# Patient Record
Sex: Female | Born: 1975 | Race: White | Hispanic: No | Marital: Single | Smoking: Former smoker
Health system: Southern US, Community
[De-identification: ages and names within clinical notes are randomized; demographics above are authoritative.]

## PROBLEM LIST (undated history)

## (undated) DIAGNOSIS — N12 Tubulo-interstitial nephritis, not specified as acute or chronic: Secondary | ICD-10-CM

## (undated) DIAGNOSIS — F329 Major depressive disorder, single episode, unspecified: Secondary | ICD-10-CM

## (undated) DIAGNOSIS — N289 Disorder of kidney and ureter, unspecified: Secondary | ICD-10-CM

## (undated) DIAGNOSIS — K859 Acute pancreatitis without necrosis or infection, unspecified: Secondary | ICD-10-CM

## (undated) HISTORY — DX: Major depressive disorder, single episode, unspecified: F32.9

## (undated) HISTORY — DX: Tubulo-interstitial nephritis, not specified as acute or chronic: N12

## (undated) HISTORY — PX: APPENDECTOMY: SHX54

## (undated) HISTORY — DX: Acute pancreatitis without necrosis or infection, unspecified: K85.90

---

## 2008-01-07 DIAGNOSIS — N12 Tubulo-interstitial nephritis, not specified as acute or chronic: Secondary | ICD-10-CM

## 2008-01-07 HISTORY — DX: Tubulo-interstitial nephritis, not specified as acute or chronic: N12

## 2008-03-17 ENCOUNTER — Encounter: Payer: Self-pay | Admitting: Emergency Medicine

## 2008-03-17 ENCOUNTER — Inpatient Hospital Stay (HOSPITAL_COMMUNITY): Admission: EM | Admit: 2008-03-17 | Discharge: 2008-03-21 | Payer: Self-pay | Admitting: Internal Medicine

## 2008-03-17 ENCOUNTER — Ambulatory Visit: Payer: Self-pay | Admitting: Diagnostic Radiology

## 2008-03-20 ENCOUNTER — Encounter: Payer: Self-pay | Admitting: Internal Medicine

## 2008-07-01 ENCOUNTER — Emergency Department (HOSPITAL_BASED_OUTPATIENT_CLINIC_OR_DEPARTMENT_OTHER): Admission: EM | Admit: 2008-07-01 | Discharge: 2008-07-01 | Payer: Self-pay | Admitting: Emergency Medicine

## 2008-07-20 ENCOUNTER — Ambulatory Visit: Payer: Self-pay | Admitting: Diagnostic Radiology

## 2008-07-20 ENCOUNTER — Emergency Department (HOSPITAL_BASED_OUTPATIENT_CLINIC_OR_DEPARTMENT_OTHER): Admission: EM | Admit: 2008-07-20 | Discharge: 2008-07-20 | Payer: Self-pay | Admitting: Emergency Medicine

## 2008-11-06 DIAGNOSIS — K859 Acute pancreatitis without necrosis or infection, unspecified: Secondary | ICD-10-CM

## 2008-11-06 HISTORY — DX: Acute pancreatitis without necrosis or infection, unspecified: K85.90

## 2008-11-09 ENCOUNTER — Inpatient Hospital Stay (HOSPITAL_COMMUNITY): Admission: EM | Admit: 2008-11-09 | Discharge: 2008-11-18 | Payer: Self-pay | Admitting: Internal Medicine

## 2008-11-09 ENCOUNTER — Other Ambulatory Visit: Payer: Self-pay | Admitting: Emergency Medicine

## 2009-01-04 ENCOUNTER — Emergency Department (HOSPITAL_COMMUNITY): Admission: EM | Admit: 2009-01-04 | Discharge: 2009-01-05 | Payer: Self-pay | Admitting: Emergency Medicine

## 2009-01-10 ENCOUNTER — Ambulatory Visit (HOSPITAL_COMMUNITY): Admission: RE | Admit: 2009-01-10 | Discharge: 2009-01-10 | Payer: Self-pay | Admitting: Gastroenterology

## 2009-01-10 HISTORY — PX: OTHER SURGICAL HISTORY: SHX169

## 2009-01-26 ENCOUNTER — Emergency Department (HOSPITAL_COMMUNITY): Admission: EM | Admit: 2009-01-26 | Discharge: 2009-01-26 | Payer: Self-pay | Admitting: Emergency Medicine

## 2009-02-17 ENCOUNTER — Emergency Department (HOSPITAL_COMMUNITY): Admission: EM | Admit: 2009-02-17 | Discharge: 2009-02-17 | Payer: Self-pay | Admitting: Emergency Medicine

## 2009-03-03 ENCOUNTER — Emergency Department (HOSPITAL_BASED_OUTPATIENT_CLINIC_OR_DEPARTMENT_OTHER): Admission: EM | Admit: 2009-03-03 | Discharge: 2009-03-04 | Payer: Self-pay | Admitting: Emergency Medicine

## 2009-03-09 ENCOUNTER — Emergency Department (HOSPITAL_BASED_OUTPATIENT_CLINIC_OR_DEPARTMENT_OTHER): Admission: EM | Admit: 2009-03-09 | Discharge: 2009-03-09 | Payer: Self-pay | Admitting: Emergency Medicine

## 2009-04-27 ENCOUNTER — Emergency Department (HOSPITAL_BASED_OUTPATIENT_CLINIC_OR_DEPARTMENT_OTHER): Admission: EM | Admit: 2009-04-27 | Discharge: 2009-04-27 | Payer: Self-pay | Admitting: Emergency Medicine

## 2009-04-27 ENCOUNTER — Ambulatory Visit: Payer: Self-pay | Admitting: Diagnostic Radiology

## 2009-05-11 ENCOUNTER — Emergency Department (HOSPITAL_COMMUNITY): Admission: EM | Admit: 2009-05-11 | Discharge: 2009-05-11 | Payer: Self-pay | Admitting: Emergency Medicine

## 2009-06-01 ENCOUNTER — Emergency Department (HOSPITAL_COMMUNITY): Admission: EM | Admit: 2009-06-01 | Discharge: 2009-06-02 | Payer: Self-pay | Admitting: Emergency Medicine

## 2009-08-27 ENCOUNTER — Emergency Department (HOSPITAL_BASED_OUTPATIENT_CLINIC_OR_DEPARTMENT_OTHER): Admission: EM | Admit: 2009-08-27 | Discharge: 2009-08-27 | Payer: Self-pay | Admitting: Emergency Medicine

## 2009-10-08 ENCOUNTER — Emergency Department (HOSPITAL_COMMUNITY): Admission: EM | Admit: 2009-10-08 | Discharge: 2009-10-08 | Payer: Self-pay | Admitting: Emergency Medicine

## 2009-10-10 ENCOUNTER — Ambulatory Visit (HOSPITAL_COMMUNITY): Admission: RE | Admit: 2009-10-10 | Discharge: 2009-10-10 | Payer: Self-pay | Admitting: Gastroenterology

## 2009-10-26 ENCOUNTER — Encounter: Admission: RE | Admit: 2009-10-26 | Discharge: 2009-10-26 | Payer: Self-pay | Admitting: Internal Medicine

## 2009-11-06 HISTORY — PX: LAPAROSCOPIC CHOLECYSTECTOMY: SUR755

## 2009-11-15 ENCOUNTER — Ambulatory Visit (HOSPITAL_COMMUNITY): Admission: RE | Admit: 2009-11-15 | Discharge: 2009-11-15 | Payer: Self-pay | Admitting: General Surgery

## 2010-01-26 ENCOUNTER — Encounter: Payer: Self-pay | Admitting: Internal Medicine

## 2010-01-27 ENCOUNTER — Encounter: Payer: Self-pay | Admitting: Gastroenterology

## 2010-01-29 ENCOUNTER — Emergency Department (HOSPITAL_BASED_OUTPATIENT_CLINIC_OR_DEPARTMENT_OTHER)
Admission: EM | Admit: 2010-01-29 | Discharge: 2010-01-29 | Payer: Self-pay | Source: Home / Self Care | Admitting: Emergency Medicine

## 2010-01-30 LAB — URINALYSIS, ROUTINE W REFLEX MICROSCOPIC
Ketones, ur: NEGATIVE mg/dL
Urine Glucose, Fasting: NEGATIVE mg/dL
pH: 6.5 (ref 5.0–8.0)

## 2010-01-30 LAB — CBC
Hemoglobin: 13.5 g/dL (ref 12.0–15.0)
MCH: 32.1 pg (ref 26.0–34.0)
RBC: 4.2 MIL/uL (ref 3.87–5.11)
WBC: 10.4 10*3/uL (ref 4.0–10.5)

## 2010-01-30 LAB — COMPREHENSIVE METABOLIC PANEL
ALT: 20 U/L (ref 0–35)
AST: 18 U/L (ref 0–37)
Alkaline Phosphatase: 92 U/L (ref 39–117)
CO2: 19 mEq/L (ref 19–32)
Chloride: 114 mEq/L — ABNORMAL HIGH (ref 96–112)
GFR calc Af Amer: >60 mL/min (ref >60)
GFR calc non Af Amer: >60 mL/min (ref >60)
Potassium: 4.2 mEq/L (ref 3.5–5.1)
Sodium: 146 mEq/L — ABNORMAL HIGH (ref 135–145)
Total Bilirubin: 0.6 mg/dL (ref 0.3–1.2)

## 2010-01-30 LAB — URINE MICROSCOPIC-ADD ON

## 2010-01-30 LAB — DIFFERENTIAL
Basophils Absolute: 0 10*3/uL (ref 0.0–0.1)
Eosinophils Absolute: 0.1 10*3/uL (ref 0.0–0.7)
Eosinophils Relative: 1 % (ref 0–5)
Lymphocytes Relative: 14 % (ref 12–46)

## 2010-02-01 LAB — URINE CULTURE

## 2010-02-12 ENCOUNTER — Encounter: Payer: Self-pay | Admitting: Gastroenterology

## 2010-02-12 ENCOUNTER — Ambulatory Visit (INDEPENDENT_AMBULATORY_CARE_PROVIDER_SITE_OTHER): Payer: Self-pay | Admitting: Gastroenterology

## 2010-02-12 ENCOUNTER — Encounter: Payer: Self-pay | Admitting: Internal Medicine

## 2010-02-12 DIAGNOSIS — K921 Melena: Secondary | ICD-10-CM

## 2010-02-12 DIAGNOSIS — R109 Unspecified abdominal pain: Secondary | ICD-10-CM

## 2010-02-14 ENCOUNTER — Ambulatory Visit (HOSPITAL_COMMUNITY)
Admission: RE | Admit: 2010-02-14 | Discharge: 2010-02-14 | Disposition: A | Discharge status: Home / Self Care | Payer: Self-pay | Source: Ambulatory Visit | Attending: Internal Medicine | Admitting: Internal Medicine

## 2010-02-14 ENCOUNTER — Encounter: Payer: Self-pay | Admitting: Internal Medicine

## 2010-02-14 DIAGNOSIS — R109 Unspecified abdominal pain: Secondary | ICD-10-CM

## 2010-02-14 DIAGNOSIS — K921 Melena: Secondary | ICD-10-CM | POA: Insufficient documentation

## 2010-02-14 HISTORY — PX: OTHER SURGICAL HISTORY: SHX169

## 2010-02-19 ENCOUNTER — Telehealth (INDEPENDENT_AMBULATORY_CARE_PROVIDER_SITE_OTHER): Payer: Self-pay

## 2010-02-21 NOTE — Letter (Signed)
Summary: TCS ORDER  TCS ORDER   Imported By: Ave Filter 02/12/2010 16:50:23  _____________________________________________________________________  External Attachment:    Type:   Image     Comment:   External Document

## 2010-02-21 NOTE — Assessment & Plan Note (Signed)
Summary: SEVERE ABD PAIN/GALLBLADDER REMOVED IN NOV/LAW PT IS INDIGENT...   Vital Signs:  Patient profile:   35 year old female Height:      67 inches Weight:      143.5 pounds BMI:     22.56 Temp:     99.1 degrees F oral Pulse rate:   80 / minute BP sitting:   128 / 70  (left arm) Cuff size:   regular  Vitals Entered By: Hendricks Limes LPN (February 12, 2010 11:06 AM)  Visit Type:  Initial Consult Referring Provider:  self-referral Primary Care Provider:  None  CC:  abd pain.  History of Present Illness: Valerie Mitchell is a 35 year old Caucasian female who presents today as a self-referral secondary to chronic abdominal pain. She is tearful and seems anxious. She has a hx significant for pancreatitis in 11/2008, thought to be ETOH related. She subsequently had an EUS by Dr. Dulce Sellar in Jan 2011 showing no findings suggestive of chronic pancreatitis, normal pancreas. Continued to have problems, was found to have essentially 0% EF on HIDA. Underwent lap chole 11/11. States pain has continued and not been relieved. No colonoscopy in past. Was being followed at [facility], yet states they won't see her anymore. I do not have these records available currently.  Pt reports today constant lower abdominal pain, described as "gnawing" pain, but worsened after eating, "stabbing" for a few months. Started 2 weeks prior to cholecystectomy, which was in Nov 2011. No dysphagia or odynophagia. No reflux. Reports postprandial loose stools with each meal, X 1 year. +constant nausea. +blood in stool, "clots" since before cholecystectomy. +wt loss, 20 lbs since cholecystectomy. +lack of appetite. Currently non-smoker, no ETOH use, stopped after pancreatitis. No NSAIDs, takes tylenol. states has been running a low-grade fever of 99-100 over past few weeks.   01/2010 labs from ED: nl lipase, LFTs normal, no anemia. See in [place].     Current Medications (verified): 1)  None  Allergies  (verified): No Known Drug Allergies  Past History:  Past Medical History: Depression: secondary to chronic issues r/t abdominal pain Pancreatitis 11/2008, likely r/t ETOH  EUS 01/10/09: normal pancreas  Past Surgical History: Lap cholecystectomy  Family History: Mother:living, HTN, hx CVA Father:living, unknown +FH of colon ca, maternal grandfather   Social History: Has a girlfriend Unemployed currently secondary to chronic pain, since Dec 2011, used to work for [employer] Patient is a former smoker. quit 2010 Alcohol Use - no, +use in past Smoking Status:  quit  Review of Systems General:  Denies fever, chills, and anorexia. Eyes:  Denies blurring, irritation, and discharge. ENT:  Denies sore throat, hoarseness, and difficulty swallowing. CV:  Denies chest pains and syncope. Resp:  Denies dyspnea at rest and wheezing. GI:  Complains of nausea, abdominal pain, diarrhea, and bloody BM's; denies difficulty swallowing and pain on swallowing. GU:  Denies urinary burning and urinary frequency. MS:  Denies joint pain / LOM, joint swelling, and joint stiffness. Derm:  Denies rash, itching, and dry skin. Neuro:  Denies weakness and syncope. Psych:  Complains of depression; denies anxiety and suicidal ideation. Endo:  Denies cold intolerance and heat intolerance.  Physical Exam  General:  Well developed, well nourished, no acute distress. Head:  Normocephalic and atraumatic. Eyes:  sclera without icterus Lungs:  Clear throughout to auscultation. Heart:  Regular rate and rhythm; no murmurs, rubs,  or bruits. Abdomen:  +BS, soft, tender to palpation bilaterally lower abdomen, no rebound or guarding, no  HSM. No masses noted.  Msk:  Symmetrical with no gross deformities. Normal posture. Pulses:  Normal pulses noted. Extremities:  No clubbing, cyanosis, edema or deformities noted. Neurologic:  Alert and  oriented x4;  grossly normal neurologically. Skin:  Intact without  significant lesions or rashes. Psych:  Alert and cooperative. tearful.    Impression & Recommendations:  Problem # 1:  ABDOMINAL PAIN, LOWER (ICD-1.54)  35 year old Caucasian female, quite concerned and tearful today, self-referred secondary to chronic pain. Was being followed at [facility]; reports they won't see her anymore. hx significant for pancreatitis, ETOH related in 2010, f/u EUS in Jan 2011 without findings of chronic pancreatitis. Lap chole Nov 2011. Continued issues with chronic abdominal pain, now reported as lower abdomen, gnawing and stabbing, worsened 30 min after eating. +postprandial loose stools with brbpr, clots. No prior colonoscopy. May be component of untreated IBS, can't exclude IBD. As of note, some concerns mentioned in records concerning drug-seeking.    Obtain records from [facility] in very near future with Dr. Jena Gauss: the R/B/A have been discussed in detail. Pt states understanding.  Further rec's at colonoscopy  Orders: Consultation Level III (04540)  Problem # 2:  HEMATOCHEZIA (ICD-578.1)  See  #1  Orders: Consultation Level III (98119)   Orders Added: 1)  Consultation Level III [14782]

## 2010-02-22 ENCOUNTER — Encounter: Payer: Self-pay | Admitting: Internal Medicine
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
## 2010-02-27 ENCOUNTER — Encounter: Payer: Self-pay | Admitting: Internal Medicine

## 2010-02-27 NOTE — Progress Notes (Addendum)
Summary: phone note/ continues to have nausea and abd pain  Phone Note Call from Patient   Caller: Patient Summary of Call: Pt left VM that Levsin is not helping. She has nausea constantly after everthing she eats....continues to have  abd pain. Please advise! Initial call taken by: Cloria Spring LPN,  February 19, 2010 4:50 PM     Appended Document: phone note/ continues to have nausea and abd pain may start prilosec 20 mg by mouth daily, disp#30 with 1 refill. May pick up samples. Need PR in about 1 week UE:[id number]. Please have pt make appt with PCP. Refer to Pain Management for chronic abdominal pain.   Appended Document: phone note/ continues to have nausea and abd pain pt aware  Appended Document: phone note/ continues to have nausea and abd pain Pt states she is experiencing N/V, states "everything she eats I throw up 15 minutes later". Pt recently had colonoscopy. At office visit, did report nausea but was more related to lower abdominal pain/stool habits, etc. She had no dysphagia/odynophagia or reflux symptoms at that time.  Pt did have an EUS with Dr Dulce Sellar in Jan 2011...documented at that time were duodenal erosions, likely secondary to chronic NSAID use. Pt most likely will need an EGD to assess for PUD, gastric outlet obstruction. Will d/w Dr. Jena Gauss 2/16 and proceed further with rec's at that time. Pt aware.   Appended Document: phone note/ continues to have nausea and abd pain have pt do a clear liquid diet for the next 24 hours, then progress slowly with soft foods. Continue to take Prilosec daily. EGD will be tentatively scheduled for next week. If condition worsens, associated with severe abdominal pain, unable to tolerate liquids, needs to seek medical attention. as of note, pt is s/p chole. recent labs/radiology have been essentially benign.   Appended Document: phone note/ continues to have nausea and abd pain Pt informed. She is starting a job on Mon and will call to  let Durward Mallard know her schedule,  so her EGD can be scheduled.   Appended Document: phone note/ continues to have nausea and abd pain Referral faxed to pain clinic.Marland KitchenMarland KitchenI called pt to schedule egd, no answer,lmom.

## 2010-02-27 NOTE — Letter (Signed)
Summary: PAIN CLINIC REFERRAL   PAIN CLINIC REFERRAL   Imported By: Ave Filter 02/22/2010 09:29:02  _____________________________________________________________________  External Attachment:    Type:   Image     Comment:   External Document

## 2010-03-05 NOTE — Letter (Signed)
Summary: EGD ORDER  EGD ORDER   Imported By: Ave Filter 02/27/2010 13:29:13  _____________________________________________________________________  External Attachment:    Type:   Image     Comment:   External Document

## 2010-03-07 ENCOUNTER — Ambulatory Visit (HOSPITAL_COMMUNITY)
Admission: RE | Admit: 2010-03-07 | Discharge: 2010-03-07 | Disposition: A | Discharge status: Home / Self Care | Payer: Self-pay | Source: Ambulatory Visit | Attending: Internal Medicine | Admitting: Internal Medicine

## 2010-03-07 ENCOUNTER — Encounter: Payer: Self-pay | Admitting: Internal Medicine

## 2010-03-07 DIAGNOSIS — R109 Unspecified abdominal pain: Secondary | ICD-10-CM | POA: Insufficient documentation

## 2010-03-07 DIAGNOSIS — R112 Nausea with vomiting, unspecified: Secondary | ICD-10-CM | POA: Insufficient documentation

## 2010-03-07 DIAGNOSIS — K449 Diaphragmatic hernia without obstruction or gangrene: Secondary | ICD-10-CM | POA: Insufficient documentation

## 2010-03-07 LAB — HEPATIC FUNCTION PANEL
ALT: 36 U/L — ABNORMAL HIGH (ref 0–35)
Albumin: 3.9 g/dL (ref 3.5–5.2)
Alkaline Phosphatase: 77 U/L (ref 39–117)
Total Protein: 6.7 g/dL (ref 6.0–8.3)

## 2010-03-07 LAB — CBC
Hemoglobin: 12.8 g/dL (ref 12.0–15.0)
Platelets: 181 10*3/uL (ref 150–400)
RBC: 3.94 MIL/uL (ref 3.87–5.11)

## 2010-03-07 LAB — LIPASE, BLOOD: Lipase: 19 U/L (ref 11–59)

## 2010-03-08 ENCOUNTER — Encounter: Payer: Self-pay | Admitting: Internal Medicine

## 2010-03-08 ENCOUNTER — Other Ambulatory Visit: Payer: Self-pay | Admitting: Internal Medicine

## 2010-03-08 DIAGNOSIS — R112 Nausea with vomiting, unspecified: Secondary | ICD-10-CM

## 2010-03-08 DIAGNOSIS — R110 Nausea: Secondary | ICD-10-CM

## 2010-03-08 DIAGNOSIS — R109 Unspecified abdominal pain: Secondary | ICD-10-CM

## 2010-03-08 HISTORY — PX: OTHER SURGICAL HISTORY: SHX169

## 2010-03-11 ENCOUNTER — Telehealth (INDEPENDENT_AMBULATORY_CARE_PROVIDER_SITE_OTHER): Payer: Self-pay

## 2010-03-12 ENCOUNTER — Other Ambulatory Visit: Payer: Self-pay | Admitting: Obstetrics & Gynecology

## 2010-03-12 ENCOUNTER — Other Ambulatory Visit (HOSPITAL_COMMUNITY)
Admission: RE | Admit: 2010-03-12 | Discharge: 2010-03-12 | Disposition: A | Discharge status: Home / Self Care | Payer: Self-pay | Source: Ambulatory Visit | Attending: Obstetrics & Gynecology | Admitting: Obstetrics & Gynecology

## 2010-03-12 ENCOUNTER — Encounter (HOSPITAL_COMMUNITY): Payer: Self-pay

## 2010-03-12 DIAGNOSIS — Z01419 Encounter for gynecological examination (general) (routine) without abnormal findings: Secondary | ICD-10-CM | POA: Insufficient documentation

## 2010-03-13 NOTE — Op Note (Addendum)
  NAME:  Valerie Mitchell, Valerie Mitchell          ACCOUNT NO.:  [id number]  MEDICAL RECORD NO.:  [id number]           PATIENT TYPE:  O  LOCATION:  DAYP                          FACILITY:  [facility]  PHYSICIAN:  R. Roetta Sessions, M.D. DATE OF BIRTH:  [birth year]  DATE OF PROCEDURE:  03/07/2010 DATE OF DISCHARGE:                              OPERATIVE REPORT   INDICATIONS FOR PROCEDURE:  A 35 year old lady with chronic abdominal pain, nausea and vomiting, EUS colonoscopy negative, status post cholecystectomy (has not helped).  EGD is now being done to further evaluate her symptoms.  Risks, benefits, alternatives, limitations, and imponderables have been discussed, questions answered.  Please see the documentation in the medical record.  PROCEDURE NOTE:  O2 saturation, blood pressure, pulse, and respirations were monitored throughout the entire procedure.  CONSCIOUS SEDATION:  Versed 6 mg IV, Demerol 150 mg IV in divided doses. Cetacaine spray for topical pharyngeal anesthesia.  INSTRUMENT:  Pentax video chip system.  FINDINGS:  Examination of tubular esophagus revealed no mucosal abnormalities.  EG junction easily traversed.  Stomach:  Gastric cavity had quite a bit of bile-stained mucus, otherwise empty.  Thorough examination of the gastric mucosa including retroflexed proximal stomach esophagogastric junction demonstrated only a small hiatal hernia. Pylorus is patent and easily traversed.  Examination of bulb and second portion revealed no abnormalities.  THERAPEUTIC/DIAGNOSTIC MANEUVERS PERFORMED:  None.  The patient tolerated the procedure well, was reactive to endoscopy.  IMPRESSION:  Normal esophagus, small hiatal hernia, bile-stained gastric mucus, otherwise normal stomach and D1 and D2.  RECOMMENDATIONS: 1. Repeat lipase, CBC, LFTs, sed rate today. 2. Proceed with solid-phase gastric emptying study to further evaluate     her symptoms of predominant nausea. 3. Further recommendations  to follow.     [name], M.D.     RMR/MEDQ  D:  03/07/2010  T:  03/07/2010  Job:  [id number]  Electronically Signed by Lorrin Goodell M.D. on 03/12/2010 02:43:30 PM Electronically Signed by Lorrin Goodell M.D. on 03/12/2010 03:07:30 PM Electronically Signed by Lorrin Goodell M.D. on 03/12/2010 04:54:09 PM Electronically Signed by Lorrin Goodell M.D. on 03/12/2010 04:06:48 PM Electronically Signed by Lorrin Goodell M.D. on 03/12/2010 04:38:07 PM Electronically Signed by Lorrin Goodell M.D. on 03/12/2010 05:09:42 PM Electronically Signed by Lorrin Goodell M.D. on 03/12/2010 05:09:42 PM Electronically Signed by Lorrin Goodell M.D. on 03/12/2010 05:38:39 PM Electronically Signed by Lorrin Goodell M.D. on 03/12/2010 06:27:44 PM Electronically Signed by Lorrin Goodell M.D. on 03/12/2010 07:36:58 PM

## 2010-03-14 NOTE — Letter (Signed)
Summary: GES ORDER  GES ORDER   Imported By: Ave Filter 03/08/2010 10:21:45  _____________________________________________________________________  External Attachment:    Type:   Image     Comment:   External Document

## 2010-03-18 ENCOUNTER — Encounter (HOSPITAL_COMMUNITY)
Admission: RE | Admit: 2010-03-18 | Discharge: 2010-03-18 | Disposition: A | Discharge status: Home / Self Care | Payer: Self-pay | Source: Ambulatory Visit | Attending: Internal Medicine | Admitting: Internal Medicine

## 2010-03-18 ENCOUNTER — Emergency Department (HOSPITAL_COMMUNITY): Payer: Self-pay

## 2010-03-18 ENCOUNTER — Encounter (HOSPITAL_COMMUNITY): Payer: Self-pay

## 2010-03-18 ENCOUNTER — Emergency Department (HOSPITAL_COMMUNITY)
Admission: EM | Admit: 2010-03-18 | Discharge: 2010-03-19 | Disposition: A | Discharge status: Home / Self Care | Payer: Self-pay | Attending: Emergency Medicine | Admitting: Emergency Medicine

## 2010-03-18 DIAGNOSIS — Y92009 Unspecified place in unspecified non-institutional (private) residence as the place of occurrence of the external cause: Secondary | ICD-10-CM | POA: Insufficient documentation

## 2010-03-18 DIAGNOSIS — S0990XA Unspecified injury of head, initial encounter: Secondary | ICD-10-CM | POA: Insufficient documentation

## 2010-03-18 DIAGNOSIS — W010XXA Fall on same level from slipping, tripping and stumbling without subsequent striking against object, initial encounter: Secondary | ICD-10-CM | POA: Insufficient documentation

## 2010-03-18 DIAGNOSIS — M542 Cervicalgia: Secondary | ICD-10-CM | POA: Insufficient documentation

## 2010-03-18 DIAGNOSIS — N289 Disorder of kidney and ureter, unspecified: Secondary | ICD-10-CM | POA: Insufficient documentation

## 2010-03-18 DIAGNOSIS — D649 Anemia, unspecified: Secondary | ICD-10-CM | POA: Insufficient documentation

## 2010-03-18 DIAGNOSIS — M79609 Pain in unspecified limb: Secondary | ICD-10-CM | POA: Insufficient documentation

## 2010-03-18 DIAGNOSIS — R1013 Epigastric pain: Secondary | ICD-10-CM | POA: Insufficient documentation

## 2010-03-18 DIAGNOSIS — E876 Hypokalemia: Secondary | ICD-10-CM | POA: Insufficient documentation

## 2010-03-18 DIAGNOSIS — K3189 Other diseases of stomach and duodenum: Secondary | ICD-10-CM | POA: Insufficient documentation

## 2010-03-18 DIAGNOSIS — R51 Headache: Secondary | ICD-10-CM | POA: Insufficient documentation

## 2010-03-18 DIAGNOSIS — R110 Nausea: Secondary | ICD-10-CM | POA: Insufficient documentation

## 2010-03-18 HISTORY — DX: Disorder of kidney and ureter, unspecified: N28.9

## 2010-03-18 MED ORDER — TECHNETIUM TC 99M SULFUR COLLOID
2.0000 | Freq: Once | INTRAVENOUS | Status: AC | PRN
  Administered 2010-03-18: 2.1 via INTRAVENOUS

## 2010-03-19 ENCOUNTER — Emergency Department (HOSPITAL_COMMUNITY): Payer: Self-pay

## 2010-03-19 LAB — DIFFERENTIAL
Basophils Absolute: 0 10*3/uL (ref 0.0–0.1)
Basophils Relative: 0 % (ref 0–1)
Lymphocytes Relative: 22 % (ref 12–46)
Monocytes Absolute: 1.2 10*3/uL — ABNORMAL HIGH (ref 0.1–1.0)
Neutro Abs: 9.6 10*3/uL — ABNORMAL HIGH (ref 1.7–7.7)
Neutrophils Relative %: 69 % (ref 43–77)

## 2010-03-19 LAB — CBC
HCT: 31.6 % — ABNORMAL LOW (ref 36.0–46.0)
Hemoglobin: 11 g/dL — ABNORMAL LOW (ref 12.0–15.0)
MCHC: 34.8 g/dL (ref 30.0–36.0)
MCV: 96.8 fL (ref 78.0–100.0)
Platelets: 288 10*3/uL (ref 150–400)
RBC: 3.37 MIL/uL — ABNORMAL LOW (ref 3.87–5.11)
RBC: 3.74 MIL/uL — ABNORMAL LOW (ref 3.87–5.11)
RDW: 13.8 % (ref 11.5–15.5)
WBC: 10 10*3/uL (ref 4.0–10.5)

## 2010-03-19 LAB — URINALYSIS, ROUTINE W REFLEX MICROSCOPIC
Glucose, UA: NEGATIVE mg/dL
Hgb urine dipstick: NEGATIVE
Protein, ur: NEGATIVE mg/dL
Specific Gravity, Urine: <1.005 — ABNORMAL LOW (ref 1.005–1.030)
pH: 5.5 (ref 5.0–8.0)

## 2010-03-19 LAB — BASIC METABOLIC PANEL
CO2: 20 mEq/L (ref 19–32)
Chloride: 106 mEq/L (ref 96–112)
Creatinine, Ser: 4.53 mg/dL — ABNORMAL HIGH (ref 0.4–1.2)
GFR calc Af Amer: 13 mL/min — ABNORMAL LOW (ref >60)
Potassium: 2.8 mEq/L — ABNORMAL LOW (ref 3.5–5.1)
Sodium: 139 mEq/L (ref 135–145)

## 2010-03-19 LAB — URINE MICROSCOPIC-ADD ON

## 2010-03-19 LAB — SURGICAL PCR SCREEN: Staphylococcus aureus: NEGATIVE

## 2010-03-19 LAB — PREGNANCY, URINE: Preg Test, Ur: NEGATIVE

## 2010-03-19 NOTE — Progress Notes (Addendum)
Summary: phone note/ pt having chest and right side pain  Phone Note Call from Patient   Caller: Patient Summary of Call: Pt called and said she cannot get in to the pain center until mid April. She is having some upper chest pain which is constant. The pain goes down the right side of her right breast. The pain gets sharper when she eats, although she is eating bland foods as instructed. She is scheduled for a GES tomorrow. Wants to know if there is anything that she can do for the pain before her appt with pain management in mid April. Please advise! Initial call taken by: Cloria Spring LPN,  March 11, 2010 3:23 PM     Appended Document: phone note/ pt having chest and right side pain see PCP  Appended Document: phone note/ pt having chest and right side pain LMOM to call.  Appended Document: phone note/ pt having chest and right side pain LMOM to call.   Appended Document: phone note/ pt having chest and right side pain Called, LMOM to call.   Appended Document: phone note/ pt having chest and right side pain LMOM to call.

## 2010-03-22 LAB — URINE MICROSCOPIC-ADD ON

## 2010-03-22 LAB — WET PREP, GENITAL
Clue Cells Wet Prep HPF POC: NONE SEEN
Trich, Wet Prep: NONE SEEN
WBC, Wet Prep HPF POC: NONE SEEN
Yeast Wet Prep HPF POC: NONE SEEN

## 2010-03-22 LAB — URINALYSIS, ROUTINE W REFLEX MICROSCOPIC
Bilirubin Urine: NEGATIVE
Glucose, UA: NEGATIVE mg/dL
Hgb urine dipstick: NEGATIVE
Ketones, ur: NEGATIVE mg/dL
Nitrite: NEGATIVE
Protein, ur: NEGATIVE mg/dL
Specific Gravity, Urine: 1.03 (ref 1.005–1.030)
Urobilinogen, UA: 0.2 mg/dL (ref 0.0–1.0)
pH: 6 (ref 5.0–8.0)

## 2010-03-22 LAB — COMPREHENSIVE METABOLIC PANEL
AST: 40 U/L — ABNORMAL HIGH (ref 0–37)
Albumin: 4.4 g/dL (ref 3.5–5.2)
BUN: 12 mg/dL (ref 6–23)
Creatinine, Ser: 0.8 mg/dL (ref 0.4–1.2)
GFR calc Af Amer: >60 mL/min (ref >60)
Potassium: 3.9 mEq/L (ref 3.5–5.1)
Total Protein: 8.2 g/dL (ref 6.0–8.3)

## 2010-03-22 LAB — DIFFERENTIAL
Eosinophils Absolute: 0.2 10*3/uL (ref 0.0–0.7)
Eosinophils Relative: 3 % (ref 0–5)
Lymphocytes Relative: 32 % (ref 12–46)
Lymphs Abs: 2.1 10*3/uL (ref 0.7–4.0)
Monocytes Relative: 5 % (ref 3–12)
Neutrophils Relative %: 59 % (ref 43–77)

## 2010-03-22 LAB — URINE CULTURE

## 2010-03-22 LAB — CBC
MCV: 96.6 fL (ref 78.0–100.0)
Platelets: 325 10*3/uL (ref 150–400)
RBC: 3.71 MIL/uL — ABNORMAL LOW (ref 3.87–5.11)
RDW: 12.6 % (ref 11.5–15.5)
WBC: 6.7 10*3/uL (ref 4.0–10.5)

## 2010-03-22 LAB — LIPASE, BLOOD: Lipase: <10 U/L — ABNORMAL LOW (ref 23–300)

## 2010-03-22 LAB — GC/CHLAMYDIA PROBE AMP, GENITAL: GC Probe Amp, Genital: NEGATIVE

## 2010-03-22 LAB — PREGNANCY, URINE: Preg Test, Ur: NEGATIVE

## 2010-03-24 LAB — COMPREHENSIVE METABOLIC PANEL
AST: 13 U/L (ref 0–37)
Albumin: 3.9 g/dL (ref 3.5–5.2)
Alkaline Phosphatase: 53 U/L (ref 39–117)
BUN: 9 mg/dL (ref 6–23)
CO2: 24 mEq/L (ref 19–32)
Chloride: 108 mEq/L (ref 96–112)
GFR calc non Af Amer: >60 mL/min (ref >60)
Potassium: 4 mEq/L (ref 3.5–5.1)
Total Bilirubin: 0.3 mg/dL (ref 0.3–1.2)

## 2010-03-24 LAB — URINE CULTURE

## 2010-03-24 LAB — URINALYSIS, ROUTINE W REFLEX MICROSCOPIC
Bilirubin Urine: NEGATIVE
Ketones, ur: NEGATIVE mg/dL
Nitrite: NEGATIVE
Protein, ur: NEGATIVE mg/dL
Specific Gravity, Urine: 1.022 (ref 1.005–1.030)
Urobilinogen, UA: 0.2 mg/dL (ref 0.0–1.0)

## 2010-03-24 LAB — DIFFERENTIAL
Basophils Absolute: 0 10*3/uL (ref 0.0–0.1)
Basophils Relative: 0 % (ref 0–1)
Eosinophils Relative: 4 % (ref 0–5)
Monocytes Absolute: 0.3 10*3/uL (ref 0.1–1.0)
Neutro Abs: 4.4 10*3/uL (ref 1.7–7.7)

## 2010-03-24 LAB — AMYLASE: Amylase: 16 U/L (ref 0–105)

## 2010-03-24 LAB — CBC
HCT: 34.3 % — ABNORMAL LOW (ref 36.0–46.0)
Platelets: 300 10*3/uL (ref 150–400)
RBC: 3.38 MIL/uL — ABNORMAL LOW (ref 3.87–5.11)
WBC: 6.8 10*3/uL (ref 4.0–10.5)

## 2010-03-24 LAB — URINE MICROSCOPIC-ADD ON

## 2010-03-25 LAB — DIFFERENTIAL
Basophils Absolute: 0.1 10*3/uL (ref 0.0–0.1)
Basophils Relative: 1 % (ref 0–1)
Monocytes Absolute: 0.3 10*3/uL (ref 0.1–1.0)
Neutro Abs: 6.2 10*3/uL (ref 1.7–7.7)
Neutrophils Relative %: 71 % (ref 43–77)

## 2010-03-25 LAB — COMPREHENSIVE METABOLIC PANEL
Alkaline Phosphatase: 57 U/L (ref 39–117)
BUN: 7 mg/dL (ref 6–23)
CO2: 24 mEq/L (ref 19–32)
Chloride: 107 mEq/L (ref 96–112)
Glucose, Bld: 95 mg/dL (ref 70–99)
Potassium: 4.4 mEq/L (ref 3.5–5.1)
Total Bilirubin: 0.3 mg/dL (ref 0.3–1.2)

## 2010-03-25 LAB — URINALYSIS, ROUTINE W REFLEX MICROSCOPIC
Glucose, UA: NEGATIVE mg/dL
Ketones, ur: NEGATIVE mg/dL
Protein, ur: NEGATIVE mg/dL

## 2010-03-25 LAB — CBC
HCT: 31.6 % — ABNORMAL LOW (ref 36.0–46.0)
Hemoglobin: 10.7 g/dL — ABNORMAL LOW (ref 12.0–15.0)
WBC: 8.8 10*3/uL (ref 4.0–10.5)

## 2010-03-26 LAB — URINALYSIS, ROUTINE W REFLEX MICROSCOPIC
Bilirubin Urine: NEGATIVE
Bilirubin Urine: NEGATIVE
Glucose, UA: NEGATIVE mg/dL
Hgb urine dipstick: NEGATIVE
Ketones, ur: NEGATIVE mg/dL
Nitrite: NEGATIVE
Protein, ur: NEGATIVE mg/dL
Protein, ur: NEGATIVE mg/dL
Urobilinogen, UA: 0.2 mg/dL (ref 0.0–1.0)
pH: 6 (ref 5.0–8.0)

## 2010-03-26 LAB — COMPREHENSIVE METABOLIC PANEL
Albumin: 4.3 g/dL (ref 3.5–5.2)
BUN: 11 mg/dL (ref 6–23)
BUN: 11 mg/dL (ref 6–23)
Calcium: 9.1 mg/dL (ref 8.4–10.5)
Calcium: 9.3 mg/dL (ref 8.4–10.5)
Creatinine, Ser: 0.98 mg/dL (ref 0.4–1.2)
Glucose, Bld: 88 mg/dL (ref 70–99)
Potassium: 3.8 mEq/L (ref 3.5–5.1)
Sodium: 145 mEq/L (ref 135–145)
Total Protein: 7.2 g/dL (ref 6.0–8.3)
Total Protein: 8.8 g/dL — ABNORMAL HIGH (ref 6.0–8.3)

## 2010-03-26 LAB — DIFFERENTIAL
Lymphocytes Relative: 22 % (ref 12–46)
Lymphs Abs: 1.9 10*3/uL (ref 0.7–4.0)
Lymphs Abs: 2.6 10*3/uL (ref 0.7–4.0)
Monocytes Absolute: 0.6 10*3/uL (ref 0.1–1.0)
Monocytes Relative: 8 % (ref 3–12)
Monocytes Relative: 4 % (ref 3–12)
Neutro Abs: 5.7 10*3/uL (ref 1.7–7.7)
Neutro Abs: 6 10*3/uL (ref 1.7–7.7)
Neutrophils Relative %: 65 % (ref 43–77)

## 2010-03-26 LAB — CBC
HCT: 33.1 % — ABNORMAL LOW (ref 36.0–46.0)
HCT: 37.4 % (ref 36.0–46.0)
Hemoglobin: 12.8 g/dL (ref 12.0–15.0)
MCHC: 35.2 g/dL (ref 30.0–36.0)
MCHC: 34.3 g/dL (ref 30.0–36.0)
MCV: 98.1 fL (ref 78.0–100.0)
Platelets: 212 10*3/uL (ref 150–400)
RDW: 12.4 % (ref 11.5–15.5)
RDW: 11.5 % (ref 11.5–15.5)

## 2010-03-26 LAB — URINE CULTURE: Colony Count: 50000

## 2010-03-26 LAB — D-DIMER, QUANTITATIVE: D-Dimer, Quant: <0.22 ug/mL-FEU (ref 0.00–0.48)

## 2010-03-26 LAB — URINE MICROSCOPIC-ADD ON

## 2010-03-26 LAB — LIPASE, BLOOD: Lipase: 16 U/L (ref 11–59)

## 2010-03-27 ENCOUNTER — Encounter: Payer: Self-pay | Admitting: Gastroenterology

## 2010-03-27 ENCOUNTER — Ambulatory Visit (INDEPENDENT_AMBULATORY_CARE_PROVIDER_SITE_OTHER): Payer: Self-pay | Admitting: Gastroenterology

## 2010-03-27 VITALS — BP 140/80 | HR 76 | Temp 98.1°F | Ht 66.0 in | Wt 140.5 lb

## 2010-03-27 DIAGNOSIS — D649 Anemia, unspecified: Secondary | ICD-10-CM | POA: Insufficient documentation

## 2010-03-27 DIAGNOSIS — K3184 Gastroparesis: Secondary | ICD-10-CM

## 2010-03-27 DIAGNOSIS — R799 Abnormal finding of blood chemistry, unspecified: Secondary | ICD-10-CM

## 2010-03-27 DIAGNOSIS — G8929 Other chronic pain: Secondary | ICD-10-CM

## 2010-03-27 DIAGNOSIS — R7989 Other specified abnormal findings of blood chemistry: Secondary | ICD-10-CM

## 2010-03-27 DIAGNOSIS — K529 Noninfective gastroenteritis and colitis, unspecified: Secondary | ICD-10-CM

## 2010-03-27 DIAGNOSIS — K589 Irritable bowel syndrome without diarrhea: Secondary | ICD-10-CM

## 2010-03-27 DIAGNOSIS — R1084 Generalized abdominal pain: Secondary | ICD-10-CM

## 2010-03-27 DIAGNOSIS — R1013 Epigastric pain: Secondary | ICD-10-CM

## 2010-03-27 DIAGNOSIS — R7401 Elevation of levels of liver transaminase levels: Secondary | ICD-10-CM

## 2010-03-27 DIAGNOSIS — R197 Diarrhea, unspecified: Secondary | ICD-10-CM

## 2010-03-27 LAB — COMPREHENSIVE METABOLIC PANEL
ALT: 8 U/L (ref 0–35)
AST: 15 U/L (ref 0–37)
AST: 17 U/L (ref 0–37)
Alkaline Phosphatase: 58 U/L (ref 39–117)
BUN: 11 mg/dL (ref 6–23)
CO2: 20 mEq/L (ref 19–32)
CO2: 27 mEq/L (ref 19–32)
Calcium: 9.2 mg/dL (ref 8.4–10.5)
Chloride: 104 mEq/L (ref 96–112)
Chloride: 109 mEq/L (ref 96–112)
Creatinine, Ser: 0.6 mg/dL (ref 0.4–1.2)
GFR calc Af Amer: >60 mL/min (ref >60)
GFR calc non Af Amer: >60 mL/min (ref >60)
GFR calc non Af Amer: >60 mL/min (ref >60)
Glucose, Bld: 74 mg/dL (ref 70–99)
Potassium: 3.5 mEq/L (ref 3.5–5.1)
Sodium: 138 mEq/L (ref 135–145)
Total Bilirubin: 0.3 mg/dL (ref 0.3–1.2)

## 2010-03-27 LAB — CBC
HCT: 37 % (ref 36.0–46.0)
HCT: 41 % (ref 36.0–46.0)
MCHC: 34.6 g/dL (ref 30.0–36.0)
MCV: 99.5 fL (ref 78.0–100.0)
MCV: 99.8 fL (ref 78.0–100.0)
Platelets: 319 10*3/uL (ref 150–400)
RBC: 4.11 MIL/uL (ref 3.87–5.11)
WBC: 11.6 10*3/uL — ABNORMAL HIGH (ref 4.0–10.5)

## 2010-03-27 LAB — URINALYSIS, ROUTINE W REFLEX MICROSCOPIC
Bilirubin Urine: NEGATIVE
Glucose, UA: NEGATIVE mg/dL
Hgb urine dipstick: NEGATIVE
Ketones, ur: NEGATIVE mg/dL
Nitrite: NEGATIVE
Protein, ur: NEGATIVE mg/dL
Specific Gravity, Urine: 1.011 (ref 1.005–1.030)
Urobilinogen, UA: 0.2 mg/dL (ref 0.0–1.0)
pH: 6.5 (ref 5.0–8.0)

## 2010-03-27 LAB — URINE MICROSCOPIC-ADD ON

## 2010-03-27 LAB — DIFFERENTIAL
Basophils Absolute: 0 10*3/uL (ref 0.0–0.1)
Basophils Relative: 0 % (ref 0–1)
Basophils Relative: 0 % (ref 0–1)
Eosinophils Absolute: 0.1 10*3/uL (ref 0.0–0.7)
Eosinophils Relative: 1 % (ref 0–5)
Eosinophils Relative: 1 % (ref 0–5)
Lymphocytes Relative: 24 % (ref 12–46)
Lymphs Abs: 2.8 10*3/uL (ref 0.7–4.0)
Neutrophils Relative %: 69 % (ref 43–77)

## 2010-03-27 LAB — LIPASE, BLOOD
Lipase: 22 U/L (ref 11–59)
Lipase: 24 U/L (ref 23–300)

## 2010-03-27 LAB — URINE CULTURE

## 2010-03-27 MED ORDER — HYDROCODONE-ACETAMINOPHEN 5-500 MG PO TABS: 1.0000 | ORAL_TABLET | Freq: Four times a day (QID) | ORAL | Status: DC | PRN

## 2010-03-27 MED ORDER — PROMETHAZINE HCL 25 MG PO TABS: 25.0000 mg | ORAL_TABLET | Freq: Four times a day (QID) | ORAL | Status: DC | PRN

## 2010-03-27 MED ORDER — DEXLANSOPRAZOLE 60 MG PO CPDR: 60.0000 mg | DELAYED_RELEASE_CAPSULE | Freq: Every day | ORAL | Status: AC

## 2010-03-27 NOTE — Progress Notes (Signed)
Addended by: Tana Coast on: 03/27/2010 05:03 PM   Modules accepted: Orders

## 2010-03-27 NOTE — Assessment & Plan Note (Addendum)
Followup labs per PCP.   Reviewed labs from PCP. Creatinine not done. MET-7 to be done.

## 2010-03-27 NOTE — Progress Notes (Signed)
Addended by: Tana Coast on: 03/27/2010 04:51 PM   Modules accepted: Orders

## 2010-03-27 NOTE — Assessment & Plan Note (Addendum)
Significant daily lower abdominal pain associated with alternating diarrhea and constipation. She reports a separate upper abdominal pain associated with nausea and vomiting. She states her right upper quadrant abdominal pain went away after getting her gallbladder out last fall. There was no evidence of chronic pancreatitis on her endoscopic ultrasound. EGD and colonoscopy failed to reveal a cause of her symptoms. It is felt that she had some component of functional abdominal pain/irritable bowel syndrome. She has been evaluated at [facility] as well as [facility]. She reports that she has failed Levsin , Bentyl, Librax. She is awaiting a referral appointment at the pain clinic at [facility]. Review labs done at Dr. Fletcher Anon office. May consider workup for acute intermittent porphyria. Will discuss any further workup with Dr. Jena Gauss. Followup office visit with Dr. Jena Gauss in 6 weeks. Short course of Vicodin provided for abdominal pain pending further evaluation.  Reviewed labs from PCP. Will do w/u for AIP.

## 2010-03-27 NOTE — Progress Notes (Addendum)
Valerie Mitchell is a pleasant 35 year old Caucasian female who presents for followup of abdominal pain. She was initially seen by her practice back in February at 2012. She has previously been seen at [facility] and [facility]. Since her initial office visit she has had a colonoscopy, upper endoscopy, gastric emptying study as outlined below and in the past surgical history.  Living on liquids for last week. Getting harder to breath with pressure in her upper abdomen. Epigastric pain with cramping in lower abdomen. Not associated with BMs. Tried to eat some baked chicken two days ago and still had vomiting. No heartburn. No dysphagia. Stools go from diarrhea to hard balls. Some brbpr. BM issues for one year. Tried Levbid, Bentyl, Librax with no help. Started Celexa one month ago which has helped her emotions but not her GI symptoms. She has established care with Dr. Gerda Diss. Labs two weeks ago with Dr. Gerda Diss. Pain clinic appt at [facility] is pending.  Abdominal pelvic CT from May 2011 was unremarkable except for small right ovarian cyst. CT of the head in February 2012 was unremarkable. Recent gastric emptying study was abnormal. She had a relatively large stomach, 85% of activity remaining in the stomach at 2 hours.   Labs from March 18, 2010 done in the ER showed a BUN of 30, creatinine 4.53, potassium 2.8, white blood cell count 13,900, hemoglobin 11, hematocrit 31.6, MCV 93.8, platelets 310,000. Labs from March 07, 2010 showed a hemoglobin of 12.8 , total bilirubin 0.2, alkaline phosphatase 77, AST elevated at 40, ALT elevated at 36, albumin 3.9, lipase 19, sedimentation rate elevated at 32. In January her creatinine was normal at 0.9.  Medications: Celexa 10mg  po daily  Allergies: NKDA  Past Medical History  Diagnosis Date  . Renal insufficiency   . Depression   . Pancreatitis 11/2008    likely r/t ETOH  . Pyelonephritis 2010   Past Surgical History  Procedure Date  . Laparoscopic  cholecystectomy 11/11  . Egd, dr. Ronni Rumble [facility], bile-stained gastric mucosa 03/08/10  . Tcs, dr. Elmer Ramp colon and ti 02/14/10  . Eus, dr. Chrissie Noa outlaw-->no chronic pancreatitis, duodenal erosion, gb adenomyotosis 01/10/09   Family History  Problem Relation Age of Onset  . Colon cancer Maternal Grandfather   . Stroke Mother    SH:  reports that she has been smoking Cigarettes.  She has a 16 pack-year smoking history. She has never used smokeless tobacco. She reports that she does not drink alcohol or use illicit drugs.   ROS:   General: Negative for weight loss, fever, chills, fatigue, weakness. C/O nausea.  Eyes: Negative for vision changes.   ENT: Negative for hoarseness, difficulty swallowing , nasal congestion.  CV: Negative for chest pain, angina, palpitations, dyspnea on exertion, peripheral edema.   Respiratory: Negative for dyspnea at rest, dyspnea on exertion, cough, sputum, wheezing.   GI: See history of present illness.  GU:  Negative for dysuria, hematuria, urinary incontinence, urinary frequency, nocturnal urination.   MS: Negative for joint pain, low back pain.   Derm: Negative for rash or itching.   Neuro: Negative for weakness, abnormal sensation, seizure, frequent headaches, memory loss, confusion.   Psych: Negative for anxiety, depression, suicidal ideation, hallucinations.   Endo: Negative for unusual weight change.   Heme: Negative for bruising or bleeding.  Allergy: Negative for rash or hives.  Physical Examination:   General: Well-nourished, well-developed in no acute distress.   Head: Normocephalic, atraumatic.    Eyes: Conjunctiva pink, no  icterus.  Mouth: Oropharyngeal mucosa moist and pink, no lesions erythema or exudate.  Neck: Supple without thyromegaly, masses, or lymphadenopathy.   Lungs: Clear to auscultation bilaterally.   Heart: Regular rate and rhythm, no murmurs rubs or gallops.   Abdomen: Diffuse moderate tenderness to deep  palpations. Bowel sounds are normal, nondistended , no hepatosplenomegaly or masses, no abdominal bruits or hernia , no rebound or guarding.    Extremities: No lower extremity edema.   Neuro: Alert and oriented x 4 , grossly normal neurologically.   Skin: Warm and dry, no rash or jaundice.    Psych: Alert and cooperative, normal mood and affect.   Labs from 03/19/10: WBC 11,200, H/H 11.2/33.9, MCV 97.1, ESR 30, LFTs normal, RF <10, ANA pos 1:40 speckled titer.

## 2010-03-27 NOTE — Assessment & Plan Note (Addendum)
Labs done in the ER recently showed a hemoglobin of 11. Hemoglobin had been 12.8 earlier this month. She has had labs done since then through her primary care physician. Will request labs for further review. We'll likely order a celiac panel to further evaluate anemia as well as chronic diarrhea.  Reviewed labs from PCP. Add anemia panel and celiac panel. Check ifobt.

## 2010-03-27 NOTE — Assessment & Plan Note (Signed)
Abnormal gastric emptying study was significant delay in gastric emptying. Etiology unknown. She will need to have her thyroid status checked if not recently done through the primary care office. Gastroparesis diet discussed with patient and handout provided. Discussed options of Reglan versus domperidone. I do not feel that long-term Reglan is a good option for this young patient due to the risk of tardive dyskinesia. She will call [facility] to determine the price of domperidone. She will eat multiple small meals daily. Phenergan provided as well.

## 2010-03-27 NOTE — Assessment & Plan Note (Addendum)
Followup labs per PCP. His transaminases remain elevated we'll need to consider workup for viral etiology as well as other etiologies for transaminitis.  Reviewed labs from PCP. LFTs now normal.

## 2010-03-27 NOTE — Patient Instructions (Addendum)
Find out if Domperidone will be covered with [facility] Assistance program. Call [facility] to find out how much Domperidone 5mg  tabs, #120, to take one before meals and bedtime would cost. We will do additional labs once I received labs from Dr. Fletcher Anon office. Eat 5-6 small meals daily, see Gastroparesis Diet handout provided.  F/U OV here in 6 weeks.

## 2010-03-27 NOTE — Assessment & Plan Note (Addendum)
Discuss with patient that her chronic epigastric pain is unlikely related to her gastroparesis. She reports having pain ever since she developed pancreatitis over one year ago. Unremarkable workup as outlined above as well. Review labs from PCP and further recommendations to follow. May consider adding PPI for possibility of atypical GERD symptoms.  Received labs from PCP. LFTs now normal. ANA positive and PCP sending for rheumatology referral. Will start patient on Dexilant 60mg  po daily. Samples to be given.

## 2010-03-28 LAB — HEPATIC FUNCTION PANEL
ALT: 14 U/L (ref 7–35)
Albumin: 4.3
Alkaline Phosphatase: 95 U/L
Indirect Bilirubin: 0.2
Total Protein ELP: 6.6

## 2010-03-30 LAB — BASIC METABOLIC PANEL
BUN: 19 mg/dL (ref 6–23)
CO2: 17 mEq/L — ABNORMAL LOW (ref 19–32)
Glucose, Bld: 89 mg/dL (ref 70–99)
Potassium: 4.2 mEq/L (ref 3.5–5.3)
Sodium: 141 mEq/L (ref 135–145)

## 2010-03-30 LAB — IRON AND TIBC
Iron: 58 ug/dL (ref 42–145)
TIBC: 358 ug/dL (ref 250–470)

## 2010-03-30 LAB — IGA: IgA: 126 mg/dL (ref 68–378)

## 2010-04-01 ENCOUNTER — Encounter: Payer: Self-pay | Admitting: Internal Medicine

## 2010-04-01 ENCOUNTER — Other Ambulatory Visit: Payer: Self-pay | Admitting: Gastroenterology

## 2010-04-01 DIAGNOSIS — K3184 Gastroparesis: Secondary | ICD-10-CM

## 2010-04-01 LAB — DIFFERENTIAL
Basophils Absolute: 0.1 10*3/uL (ref 0.0–0.1)
Lymphocytes Relative: 26 % (ref 12–46)
Monocytes Absolute: 0.7 10*3/uL (ref 0.1–1.0)
Neutro Abs: 9.9 10*3/uL — ABNORMAL HIGH (ref 1.7–7.7)

## 2010-04-01 LAB — URINE MICROSCOPIC-ADD ON

## 2010-04-01 LAB — COMPREHENSIVE METABOLIC PANEL
Albumin: 4.3 g/dL (ref 3.5–5.2)
BUN: 10 mg/dL (ref 6–23)
Chloride: 110 mEq/L (ref 96–112)
Creatinine, Ser: 0.8 mg/dL (ref 0.4–1.2)
GFR calc non Af Amer: >60 mL/min (ref >60)
Total Bilirubin: 0.2 mg/dL — ABNORMAL LOW (ref 0.3–1.2)

## 2010-04-01 LAB — CBC
HCT: 37.3 % (ref 36.0–46.0)
MCHC: 33.8 g/dL (ref 30.0–36.0)
MCV: 99.2 fL (ref 78.0–100.0)
Platelets: 338 10*3/uL (ref 150–400)
RDW: 12.7 % (ref 11.5–15.5)
WBC: 14.6 10*3/uL — ABNORMAL HIGH (ref 4.0–10.5)

## 2010-04-01 LAB — URINALYSIS, ROUTINE W REFLEX MICROSCOPIC
Bilirubin Urine: NEGATIVE
Glucose, UA: NEGATIVE mg/dL
Specific Gravity, Urine: 1.009 (ref 1.005–1.030)
pH: 6 (ref 5.0–8.0)

## 2010-04-01 LAB — LIPASE, BLOOD: Lipase: 50 U/L (ref 23–300)

## 2010-04-01 LAB — URINE CULTURE

## 2010-04-01 LAB — TISSUE TRANSGLUTAMINASE, IGA: Tissue Transglutaminase Ab, IgA: 3.3 U/mL (ref <20)

## 2010-04-02 ENCOUNTER — Telehealth: Payer: Self-pay

## 2010-04-02 ENCOUNTER — Other Ambulatory Visit: Payer: Self-pay | Admitting: Gastroenterology

## 2010-04-02 NOTE — Telephone Encounter (Signed)
Pt called- she is requesting written rx for Domperidone. Pt would like to try to get it from [place] since it is cheaper.

## 2010-04-02 NOTE — Progress Notes (Signed)
Reviewed by R. Michael Jenissa Tyrell, MD FACP FACG 

## 2010-04-03 ENCOUNTER — Other Ambulatory Visit: Payer: Self-pay | Admitting: Internal Medicine

## 2010-04-03 DIAGNOSIS — K3184 Gastroparesis: Secondary | ICD-10-CM

## 2010-04-03 NOTE — Progress Notes (Signed)
Rx written for domperidone 5 mg #120 Sig: 5 mg ac and qhs - 1 refill; for fax to [place]

## 2010-04-04 NOTE — Miscellaneous (Signed)
Summary: 03/18/10 cbc and bmp from [facility]  Clinical Lists Changes L-CBC-with Differential - STATUS: Final                                            Perform Date: 12Mar12 23:41  Ordered By: Rozell Searing Date: 12Mar12 23:41                                       Last Updated Date: 13Mar12 00:26  Facility: [facility]                               Department: GENL  Accession #: [id number]                   USN:       [id number]  Findings  Result Name                              Result     Abnl   Normal Range     Units      Perf. Loc.  WBC                                            13.9       h      4.0-10.5         K/uL  RBC                                              3.37       l      3.87-5.11        MIL/uL  Hemoglobin (HGB)                         11.0       l      12.0-15.0        g/dL  Hematocrit (HCT)                           31.6       l      36.0-46.0        %  MCV                                             93.8              78.0-100.0       fL  MCH -  32.6              26.0-34.0        pg  MCHC                                          34.8              30.0-36.0        g/dL  RDW                                           13.2              11.5-15.5        %  Platelet Count (PLT)                        310               150-400          K/uL  Neutrophils, %                               69                43-77            %  Lymphocytes, %                            22                12-46            %  Monocytes, %                                9                 3-12             %  Eosinophils, %                               1                 0-5              %  Basophils, %                                 0                 0-1              %  Neutrophils, Absolute                     9.6        h      1.7-7.7          K/uL  Lymphocytes, Absolute                  3.0  0.7-4.0          K/uL  Monocytes, Absolute                     1.2        h      0.1-1.0          K/uL  Eosinophils, Absolute                    0.1               0.0-0.7          K/uL  Basophils, Absolute                      0.0               0.0-0.1          K/uL  Additional Information  HL7 RESULT STATUS : F  External IF Update Timestamp : 2010-03-19:00:24:00.000000     L-BMP/BMET (Basic Metabolic Panel) - STATUS: Final                                            Perform Date: 12Mar12 23:41  Ordered By: Howie Ill  ,            Ordered Date: [phone] 23:41                                       Last Updated Date: 30QMV78 01:03  Facility: [facility]                               Department: GENL  Accession #: [id number]                    USN:       [id number]  Findings  Result Name                              Result     Abnl   Normal Range     Units      Perf. Loc.  Sodium (NA)                              139               135-145          mEq/L  Potassium (K)                            2.8        l      3.5-5.1          mEq/L  Chloride                                    106               96-112           mEq/L  CO2  20                19-32            mEq/L  Glucose                                    78                70-99            mg/dL  BUN                                         30         h      6-23             mg/dL  Creatinine                                4.53       h      0.4-1.2          mg/dL  GFR, Est Non African American     11         l      >60              mL/min  GFR, Est African American            13         l      >60              mL/min    Oversized comment, see footnote  1  Calcium                                   8.8               8.4-10.5         mg/dL  Footnotes  1. The eGFR has been calculated     using the MDRD equation.     This calculation has not been     validated in all clinical     situations.     eGFR's  persistently     <60 mL/min signify     possible Chronic Kidney Disease.  Additional Information  HL7 RESULT STATUS : F  External IF Update Timestamp : 2010-03-19:01:01:00.000000  Appended Document: 03/18/10 cbc and bmp from [facility] esr increased , mild inc lfts; ED VISIT 3/12 CREATININE GREATER THAN 4; GES abnormal. Pt in renal failure if creatinine correct from ed; this lady needs ov w [place] this week to address gastroparesis - Who is PCP?  Appended Document: 03/18/10 cbc and bmp from [facility] tried to call pt- LMOM  Appended Document: 03/18/10 cbc and bmp from [facility] pt aware, made appt for 03/27/10. pt asked what to do about pain, explained part 1 of gastroparesis diet to pt and advised she stay on clear liquids. pt pcp is Dr. Gerda Diss. stated he just did more labs at his office  Appended Document: 03/18/10 cbc and bmp from [facility] requested

## 2010-04-04 NOTE — Miscellaneous (Signed)
Summary: lab results  Clinical Lists Changes L-CBC-NO Differential - STATUS: Final                                            Perform Date: 1 Mar12 16:25  Ordered By: Jena Gauss MD , Gerrit Friends           Ordered Date: 1 Mar12 16:10                                       Last Updated Date: 1 Mar12 17:15  Facility: [facility]                               Department: GENL  Accession #: [id number]                     USN:       [id number]  Findings  Result Name                              Result     Abnl   Normal Range     Units      Perf. Loc.  WBC                                          4.4               4.0-10.5         K/uL  RBC                                          3.94              3.87-5.11        MIL/uL  Hemoglobin (HGB)                      12.8              12.0-15.0        g/dL  Hematocrit (HCT)                       38.3              36.0-46.0        %  MCV                                         97.2              78.0-100.0       fL  MCH -                                       32.5              26.0-34.0  pg  MCHC                                       33.4              30.0-36.0        g/dL  RDW                                         14.2              11.5-15.5        %  Platelet Count (PLT)                     181               150-400          K/uL  Additional Information  HL7 RESULT STATUS : F  External IF Update Timestamp : 2010-03-07:17:13:00.000000   L-Hepatic Function Panel (HFP / LFT) - STATUS: Final                                            Perform Date: 1 Mar12 16:25  Ordered By: Jena Gauss MD , Gerrit Friends           Ordered Date: 1 Mar12 16:10                                       Last Updated Date: 1 Mar12 17:20  Facility: [facility]                               Department: GENL  Accession #: [id number]                     USN:       [id number]  Findings  Result Name                              Result     Abnl   Normal Range     Units      Perf.  Loc.  Bilirubin, Total                                0.2        l      0.3-1.2          mg/dL  Bilirubin, Direct                              0.1               0.0-0.3          mg/dL  Indirect Bilirubin                             0.1  l      0.3-0.9          mg/dL  Alkaline Phosphatase                     77                39-117           U/L  SGOT (AST)                                 40         h      0-37             U/L  SGPT (ALT)                                  36         h      0-35             U/L  Total  Protein                                6.7               6.0-8.3          g/dL  Albumin-Blood                              3.9               3.5-5.2          g/dL  Additional Information  HL7 RESULT STATUS : F  External IF Update Timestamp : 2010-03-07:17:18:00.000000   L-Lipase, Blood - STATUS: Final                                            Perform Date: 1 Mar12 16:25  Ordered ByJena Gauss MD , Gerrit Friends           Ordered Date: 1 Mar12 16:10                                       Last Updated Date: 1 Mar12 17:20  Facility: [facility]                               Department: GENL  Accession #: [id number]                  USN:       [id number]  Findings  Result Name                              Result     Abnl   Normal Range     Units      Perf. Loc.  Lipase  19                11-59            U/L  Additional Information  HL7 RESULT STATUS : F  External IF Update Timestamp : 2010-03-07:17:18:00.000000    L-ESR (Erythrocyte Sedimentation Rate) - STATUS: Final                                            Perform Date: 1 Mar12 16:25  Ordered By: Jena Gauss MD , Gerrit Friends           Ordered Date: 1 Mar12 16:10                                       Last Updated Date: 1 Mar12 18:17  Facility: [facility]                               Department: GENL  Accession #: [id number]                     USN:        [id number]  Findings  Result Name                              Result     Abnl   Normal Range     Units      Perf. Loc.  ESR (Sedimentation Rate)                 32         h      0-22             mm/hr  Additional Information  HL7 RESULT STATUS : F  External IF Update Timestamp : 2010-03-07:18:15:00.000000

## 2010-04-04 NOTE — Telephone Encounter (Signed)
RX written by RMR.

## 2010-04-05 NOTE — Telephone Encounter (Signed)
Paper work at front desk for pt to pick up.

## 2010-04-08 LAB — URINE MICROSCOPIC-ADD ON

## 2010-04-08 LAB — COMPREHENSIVE METABOLIC PANEL
ALT: 15 U/L (ref 0–35)
Alkaline Phosphatase: 65 U/L (ref 39–117)
CO2: 16 mEq/L — ABNORMAL LOW (ref 19–32)
Calcium: 9.1 mg/dL (ref 8.4–10.5)
GFR calc non Af Amer: >60 mL/min (ref >60)
Glucose, Bld: 95 mg/dL (ref 70–99)
Sodium: 137 mEq/L (ref 135–145)

## 2010-04-08 LAB — DIFFERENTIAL
Basophils Relative: 1 % (ref 0–1)
Eosinophils Absolute: 0.4 10*3/uL (ref 0.0–0.7)
Neutrophils Relative %: 64 % (ref 43–77)

## 2010-04-08 LAB — URINALYSIS, ROUTINE W REFLEX MICROSCOPIC
Bilirubin Urine: NEGATIVE
Glucose, UA: NEGATIVE mg/dL
Ketones, ur: NEGATIVE mg/dL
pH: 6.5 (ref 5.0–8.0)

## 2010-04-08 LAB — LIPASE, BLOOD: Lipase: 29 U/L (ref 11–59)

## 2010-04-08 LAB — CBC
HCT: 35.6 % — ABNORMAL LOW (ref 36.0–46.0)
Hemoglobin: 12.3 g/dL (ref 12.0–15.0)
MCHC: 34.5 g/dL (ref 30.0–36.0)
RBC: 3.57 MIL/uL — ABNORMAL LOW (ref 3.87–5.11)

## 2010-04-09 LAB — PORPHOBILINOGEN, 24 HR URINE-QUANT: Porphobilinogen, 24 hr Ur: 1.7 mg/24 h (ref <2.4)

## 2010-04-09 LAB — AMINOLEVULINIC ACID, 24 HOUR: Delta Ala,  24H Ur: 0.08 mg/dL (ref 0.03–0.54)

## 2010-04-10 LAB — URINALYSIS, ROUTINE W REFLEX MICROSCOPIC
Bilirubin Urine: NEGATIVE
Glucose, UA: NEGATIVE mg/dL
Glucose, UA: NEGATIVE mg/dL
Hgb urine dipstick: NEGATIVE
Ketones, ur: NEGATIVE mg/dL
Ketones, ur: NEGATIVE mg/dL
Nitrite: NEGATIVE
Protein, ur: NEGATIVE mg/dL
Specific Gravity, Urine: 1.006 (ref 1.005–1.030)
Specific Gravity, Urine: 1.007 (ref 1.005–1.030)
Urobilinogen, UA: 0.2 mg/dL (ref 0.0–1.0)
pH: 7 (ref 5.0–8.0)

## 2010-04-10 LAB — COMPREHENSIVE METABOLIC PANEL
ALT: 10 U/L (ref 0–35)
ALT: <8 U/L (ref 0–35)
AST: 14 U/L (ref 0–37)
AST: 15 U/L (ref 0–37)
AST: 20 U/L (ref 0–37)
Albumin: 2.6 g/dL — ABNORMAL LOW (ref 3.5–5.2)
Albumin: 4.4 g/dL (ref 3.5–5.2)
Alkaline Phosphatase: 72 U/L (ref 39–117)
Alkaline Phosphatase: 96 U/L (ref 39–117)
BUN: 2 mg/dL — ABNORMAL LOW (ref 6–23)
BUN: 3 mg/dL — ABNORMAL LOW (ref 6–23)
BUN: 6 mg/dL (ref 6–23)
BUN: 8 mg/dL (ref 6–23)
CO2: 23 mEq/L (ref 19–32)
CO2: 25 mEq/L (ref 19–32)
CO2: 30 mEq/L (ref 19–32)
Calcium: 7.5 mg/dL — ABNORMAL LOW (ref 8.4–10.5)
Calcium: 9.4 mg/dL (ref 8.4–10.5)
Chloride: 102 mEq/L (ref 96–112)
Chloride: 106 mEq/L (ref 96–112)
Chloride: 108 mEq/L (ref 96–112)
Creatinine, Ser: 0.49 mg/dL (ref 0.4–1.2)
Creatinine, Ser: 0.5 mg/dL (ref 0.4–1.2)
Creatinine, Ser: 0.7 mg/dL (ref 0.4–1.2)
GFR calc Af Amer: >60 mL/min (ref >60)
GFR calc Af Amer: >60 mL/min (ref >60)
GFR calc Af Amer: >60 mL/min (ref >60)
GFR calc non Af Amer: >60 mL/min (ref >60)
GFR calc non Af Amer: >60 mL/min (ref >60)
GFR calc non Af Amer: >60 mL/min (ref >60)
Glucose, Bld: 97 mg/dL (ref 70–99)
Glucose, Bld: 99 mg/dL (ref 70–99)
Potassium: 4.6 mEq/L (ref 3.5–5.1)
Sodium: 138 mEq/L (ref 135–145)
Sodium: 141 mEq/L (ref 135–145)
Total Bilirubin: 0.2 mg/dL — ABNORMAL LOW (ref 0.3–1.2)
Total Bilirubin: 0.5 mg/dL (ref 0.3–1.2)
Total Bilirubin: 0.5 mg/dL (ref 0.3–1.2)
Total Protein: 4.8 g/dL — ABNORMAL LOW (ref 6.0–8.3)
Total Protein: 4.9 g/dL — ABNORMAL LOW (ref 6.0–8.3)

## 2010-04-10 LAB — LIPID PANEL
Cholesterol: 134 mg/dL (ref 0–200)
HDL: 26 mg/dL — ABNORMAL LOW (ref >39)
Total CHOL/HDL Ratio: 5.2 RATIO
Triglycerides: 266 mg/dL — ABNORMAL HIGH (ref <150)

## 2010-04-10 LAB — BENZODIAZEPINE, QUANTITATIVE, URINE
Alprazolam (GC/LC/MS), ur confirm: 360 ng/mL
Flurazepam GC/MS Conf: NEGATIVE
Nordiazepam GC/MS Conf: NEGATIVE

## 2010-04-10 LAB — CBC
HCT: 25.6 % — ABNORMAL LOW (ref 36.0–46.0)
HCT: 28.4 % — ABNORMAL LOW (ref 36.0–46.0)
HCT: 28.4 % — ABNORMAL LOW (ref 36.0–46.0)
HCT: 30.7 % — ABNORMAL LOW (ref 36.0–46.0)
HCT: 33.5 % — ABNORMAL LOW (ref 36.0–46.0)
Hemoglobin: 10.1 g/dL — ABNORMAL LOW (ref 12.0–15.0)
Hemoglobin: 11.6 g/dL — ABNORMAL LOW (ref 12.0–15.0)
MCHC: 34 g/dL (ref 30.0–36.0)
MCHC: 35.5 g/dL (ref 30.0–36.0)
MCV: 102.5 fL — ABNORMAL HIGH (ref 78.0–100.0)
MCV: 103.7 fL — ABNORMAL HIGH (ref 78.0–100.0)
MCV: 103.9 fL — ABNORMAL HIGH (ref 78.0–100.0)
MCV: 103.9 fL — ABNORMAL HIGH (ref 78.0–100.0)
MCV: 104 fL — ABNORMAL HIGH (ref 78.0–100.0)
Platelets: 255 10*3/uL (ref 150–400)
Platelets: 259 10*3/uL (ref 150–400)
Platelets: 361 10*3/uL (ref 150–400)
RBC: 2.73 MIL/uL — ABNORMAL LOW (ref 3.87–5.11)
RBC: 2.74 MIL/uL — ABNORMAL LOW (ref 3.87–5.11)
RBC: 3.25 MIL/uL — ABNORMAL LOW (ref 3.87–5.11)
RDW: 13 % (ref 11.5–15.5)
RDW: 13.2 % (ref 11.5–15.5)
RDW: 14.1 % (ref 11.5–15.5)
WBC: 13.4 10*3/uL — ABNORMAL HIGH (ref 4.0–10.5)
WBC: 13.5 10*3/uL — ABNORMAL HIGH (ref 4.0–10.5)
WBC: 7.6 10*3/uL (ref 4.0–10.5)

## 2010-04-10 LAB — DRUGS OF ABUSE SCREEN W/O ALC, ROUTINE URINE
Benzodiazepines.: POSITIVE — AB
Creatinine,U: 73.2 mg/dL
Marijuana Metabolite: NEGATIVE
Opiate Screen, Urine: POSITIVE — AB

## 2010-04-10 LAB — URINE MICROSCOPIC-ADD ON

## 2010-04-10 LAB — AMYLASE
Amylase: 124 U/L (ref 27–131)
Amylase: 250 U/L — ABNORMAL HIGH (ref 27–131)

## 2010-04-10 LAB — RETICULOCYTES
Retic Count, Absolute: 36.8 10*3/uL (ref 19.0–186.0)
Retic Ct Pct: 1.4 % (ref 0.4–3.1)

## 2010-04-10 LAB — URINE CULTURE: Special Requests: NEGATIVE

## 2010-04-10 LAB — BASIC METABOLIC PANEL
BUN: 1 mg/dL — ABNORMAL LOW (ref 6–23)
BUN: 2 mg/dL — ABNORMAL LOW (ref 6–23)
Calcium: 8.9 mg/dL (ref 8.4–10.5)
Chloride: 101 mEq/L (ref 96–112)
Creatinine, Ser: 0.52 mg/dL (ref 0.4–1.2)
GFR calc Af Amer: >60 mL/min (ref >60)
GFR calc non Af Amer: >60 mL/min (ref >60)
GFR calc non Af Amer: >60 mL/min (ref >60)
Glucose, Bld: 90 mg/dL (ref 70–99)
Potassium: 3.4 mEq/L — ABNORMAL LOW (ref 3.5–5.1)

## 2010-04-10 LAB — OPIATE, QUANTITATIVE, URINE
Morphine, Confirm: NEGATIVE ng/mL
Oxycodone, ur: NEGATIVE ng/mL

## 2010-04-10 LAB — LIPASE, BLOOD
Lipase: 29 U/L (ref 11–59)
Lipase: 61 U/L — ABNORMAL HIGH (ref 11–59)
Lipase: 767 U/L — ABNORMAL HIGH (ref 11–59)

## 2010-04-10 LAB — DIFFERENTIAL
Eosinophils Relative: 1 % (ref 0–5)
Eosinophils Relative: 2 % (ref 0–5)
Lymphocytes Relative: 9 % — ABNORMAL LOW (ref 12–46)
Lymphs Abs: 1.2 10*3/uL (ref 0.7–4.0)
Lymphs Abs: 1.2 10*3/uL (ref 0.7–4.0)
Monocytes Absolute: 0.6 10*3/uL (ref 0.1–1.0)
Monocytes Relative: 4 % (ref 3–12)
Monocytes Relative: 5 % (ref 3–12)
Neutro Abs: 11.3 10*3/uL — ABNORMAL HIGH (ref 1.7–7.7)

## 2010-04-10 LAB — PREGNANCY, URINE: Preg Test, Ur: NEGATIVE

## 2010-04-10 LAB — LACTATE DEHYDROGENASE: LDH: 117 U/L (ref 94–250)

## 2010-04-11 NOTE — Progress Notes (Signed)
Results done, addressed in appropriate lab result note.

## 2010-04-11 NOTE — Progress Notes (Signed)
Addressed under appropriate lab result note.

## 2010-04-14 LAB — URINE MICROSCOPIC-ADD ON

## 2010-04-14 LAB — BASIC METABOLIC PANEL
Calcium: 9.1 mg/dL (ref 8.4–10.5)
Creatinine, Ser: 0.6 mg/dL (ref 0.4–1.2)
GFR calc Af Amer: >60 mL/min (ref >60)
GFR calc non Af Amer: >60 mL/min (ref >60)
Sodium: 142 mEq/L (ref 135–145)

## 2010-04-14 LAB — URINALYSIS, ROUTINE W REFLEX MICROSCOPIC
Ketones, ur: NEGATIVE mg/dL
Protein, ur: NEGATIVE mg/dL
Urobilinogen, UA: 0.2 mg/dL (ref 0.0–1.0)

## 2010-04-14 LAB — URINE CULTURE
Colony Count: NO GROWTH
Culture: NO GROWTH

## 2010-04-15 LAB — CBC
Hemoglobin: 13.3 g/dL (ref 12.0–15.0)
Platelets: 265 10*3/uL (ref 150–400)
RDW: 12.2 % (ref 11.5–15.5)

## 2010-04-15 LAB — URINALYSIS, ROUTINE W REFLEX MICROSCOPIC
Bilirubin Urine: NEGATIVE
Glucose, UA: NEGATIVE mg/dL
Ketones, ur: NEGATIVE mg/dL
Protein, ur: NEGATIVE mg/dL
pH: 6.5 (ref 5.0–8.0)

## 2010-04-15 LAB — DIFFERENTIAL
Basophils Absolute: 0 10*3/uL (ref 0.0–0.1)
Lymphocytes Relative: 28 % (ref 12–46)
Neutro Abs: 5.7 10*3/uL (ref 1.7–7.7)

## 2010-04-15 LAB — BASIC METABOLIC PANEL
BUN: 5 mg/dL — ABNORMAL LOW (ref 6–23)
Calcium: 8.8 mg/dL (ref 8.4–10.5)
GFR calc non Af Amer: >60 mL/min (ref >60)
Glucose, Bld: 98 mg/dL (ref 70–99)
Sodium: 143 mEq/L (ref 135–145)

## 2010-04-15 LAB — PREGNANCY, URINE: Preg Test, Ur: NEGATIVE

## 2010-04-15 LAB — URINE CULTURE
Colony Count: NO GROWTH
Culture: NO GROWTH

## 2010-04-18 ENCOUNTER — Encounter: Payer: Self-pay | Admitting: Gastroenterology

## 2010-04-18 LAB — URINE CULTURE: Colony Count: >=100000

## 2010-04-18 LAB — CBC
HCT: 32.6 % — ABNORMAL LOW (ref 36.0–46.0)
HCT: 35.7 % — ABNORMAL LOW (ref 36.0–46.0)
Hemoglobin: 10.5 g/dL — ABNORMAL LOW (ref 12.0–15.0)
MCHC: 35.1 g/dL (ref 30.0–36.0)
MCHC: 35.6 g/dL (ref 30.0–36.0)
MCHC: 35.9 g/dL (ref 30.0–36.0)
MCV: 97.4 fL (ref 78.0–100.0)
MCV: 96.3 fL (ref 78.0–100.0)
MCV: 97.5 fL (ref 78.0–100.0)
Platelets: 110 10*3/uL — ABNORMAL LOW (ref 150–400)
Platelets: 124 10*3/uL — ABNORMAL LOW (ref 150–400)
Platelets: 89 10*3/uL — ABNORMAL LOW (ref 150–400)
RBC: 3.35 MIL/uL — ABNORMAL LOW (ref 3.87–5.11)
RBC: 2.86 MIL/uL — ABNORMAL LOW (ref 3.87–5.11)
RBC: 3.01 MIL/uL — ABNORMAL LOW (ref 3.87–5.11)
RBC: 3.06 MIL/uL — ABNORMAL LOW (ref 3.87–5.11)
RDW: 14.3 % (ref 11.5–15.5)
RDW: 15.6 % — ABNORMAL HIGH (ref 11.5–15.5)
WBC: 10.9 10*3/uL — ABNORMAL HIGH (ref 4.0–10.5)
WBC: 10.1 10*3/uL (ref 4.0–10.5)

## 2010-04-18 LAB — BASIC METABOLIC PANEL
BUN: 12 mg/dL (ref 6–23)
BUN: 22 mg/dL (ref 6–23)
CO2: 22 mEq/L (ref 19–32)
Calcium: 7.6 mg/dL — ABNORMAL LOW (ref 8.4–10.5)
Calcium: 8.3 mg/dL — ABNORMAL LOW (ref 8.4–10.5)
Calcium: 8.3 mg/dL — ABNORMAL LOW (ref 8.4–10.5)
Chloride: 103 mEq/L (ref 96–112)
Chloride: 103 mEq/L (ref 96–112)
Creatinine, Ser: 1.48 mg/dL — ABNORMAL HIGH (ref 0.4–1.2)
Creatinine, Ser: 1.83 mg/dL — ABNORMAL HIGH (ref 0.4–1.2)
Creatinine, Ser: 1.92 mg/dL — ABNORMAL HIGH (ref 0.4–1.2)
GFR calc Af Amer: 56 mL/min — ABNORMAL LOW (ref >60)
GFR calc Af Amer: 36 mL/min — ABNORMAL LOW (ref >60)
GFR calc Af Amer: 38 mL/min — ABNORMAL LOW (ref >60)
GFR calc Af Amer: 49 mL/min — ABNORMAL LOW (ref >60)
GFR calc non Af Amer: 30 mL/min — ABNORMAL LOW (ref >60)
GFR calc non Af Amer: 41 mL/min — ABNORMAL LOW (ref >60)
Potassium: 3.6 mEq/L (ref 3.5–5.1)
Sodium: 126 mEq/L — ABNORMAL LOW (ref 135–145)

## 2010-04-18 LAB — ALBUMIN: Albumin: 2.1 g/dL — ABNORMAL LOW (ref 3.5–5.2)

## 2010-04-18 LAB — COMPREHENSIVE METABOLIC PANEL
Albumin: 3.6 g/dL (ref 3.5–5.2)
Alkaline Phosphatase: 218 U/L — ABNORMAL HIGH (ref 39–117)
BUN: 29 mg/dL — ABNORMAL HIGH (ref 6–23)
Calcium: 8.4 mg/dL (ref 8.4–10.5)
Creatinine, Ser: 2.5 mg/dL — ABNORMAL HIGH (ref 0.4–1.2)
Potassium: 2.9 mEq/L — ABNORMAL LOW (ref 3.5–5.1)
Total Protein: 7.4 g/dL (ref 6.0–8.3)

## 2010-04-18 LAB — DIFFERENTIAL
Basophils Relative: 0 % (ref 0–1)
Eosinophils Relative: 0 % (ref 0–5)
Lymphocytes Relative: 4 % — ABNORMAL LOW (ref 12–46)
Monocytes Relative: 3 % (ref 3–12)
Neutrophils Relative %: 93 % — ABNORMAL HIGH (ref 43–77)

## 2010-04-18 LAB — CULTURE, BLOOD (ROUTINE X 2)
Culture: NO GROWTH
Culture: NO GROWTH

## 2010-04-18 LAB — URINALYSIS, ROUTINE W REFLEX MICROSCOPIC
Glucose, UA: NEGATIVE mg/dL
Specific Gravity, Urine: 1.019 (ref 1.005–1.030)
Urobilinogen, UA: 0.2 mg/dL (ref 0.0–1.0)

## 2010-04-18 LAB — BLOOD GAS, ARTERIAL
Acid-base deficit: 7.1 mmol/L — ABNORMAL HIGH (ref 0.0–2.0)
O2 Content: 4 L/min
O2 Saturation: 96.8 %
TCO2: 17.2 mmol/L (ref 0–100)
pCO2 arterial: 25.4 mmHg — ABNORMAL LOW (ref 35.0–45.0)
pH, Arterial: 7.428 — ABNORMAL HIGH (ref 7.350–7.400)

## 2010-04-18 LAB — URINE MICROSCOPIC-ADD ON

## 2010-04-18 LAB — BRAIN NATRIURETIC PEPTIDE
Pro B Natriuretic peptide (BNP): 875 pg/mL — ABNORMAL HIGH (ref 0.0–100.0)
Pro B Natriuretic peptide (BNP): 946 pg/mL — ABNORMAL HIGH (ref 0.0–100.0)

## 2010-05-02 ENCOUNTER — Other Ambulatory Visit: Payer: Self-pay | Admitting: Urgent Care

## 2010-05-13 ENCOUNTER — Other Ambulatory Visit: Payer: Self-pay

## 2010-05-13 NOTE — Telephone Encounter (Signed)
Please call pt & let her know that this medication will make her GI tract slow.  She should avoid narcotics if possible.

## 2010-05-13 NOTE — Telephone Encounter (Signed)
Tried to call patient at 11:28am. No answer/LMOM.

## 2010-05-20 ENCOUNTER — Inpatient Hospital Stay: Payer: Self-pay | Admitting: Urgent Care

## 2010-05-21 ENCOUNTER — Other Ambulatory Visit: Payer: Self-pay

## 2010-05-21 NOTE — Discharge Summary (Signed)
Valerie Mitchell, Valerie Mitchell          ACCOUNT NO.:  [id number]   MEDICAL RECORD NO.:  [id number]          PATIENT TYPE:  INP   LOCATION:  6735                         FACILITY:  [facility]   PHYSICIAN:  Isidor Holts, M.D.  DATE OF BIRTH:  01/06/1976   DATE OF ADMISSION:  03/17/2008  DATE OF DISCHARGE:  03/21/2008                               DISCHARGE SUMMARY   PRIMARY MEDICAL DOCTOR:  Derrill Center, MD, [place], [place].   DISCHARGE DIAGNOSES:  1. Acute pyelonephritis, secondary to Escherichia coli.  2. Dehydration/acute renal failure.  3. Smoking history.  4. Mild fluid overload, secondary to intravenous fluid administration.   DISCHARGE MEDICATIONS:  1. Ciprofloxacin 500 mg p.o. b.i.d. for 5 days from March 22, 2008.  2. Darvocet-N 100 one p.o. p.r.n. q.6 h., a total of 20 pills have      been dispensed.   PROCEDURE:  1. Pelvic CT scan dated March 17, 2008.  This showed a small amount of      fluid along the right paracolic gutter and along the anterior right      pararenal fascia.  No evidence of right nephrolithiasis or      ureterolithiasis.  This finding could represent sequelae of passed      calculus.  Cannot exclude pyelonephritis on this noncontrast exam.      Gallbladder distention without evidence of acute cholecystitis.      Appendix appears normal.  CT pelvis showed no acute pelvic process.      There was mild bilateral perinephric stranding greater on the right  2. Chest x-ray dated March 20, 2008.  This showed pulmonary vascular      congestion.  3. Ventilation-perfusion lung scan dated March 21, 2008, this was a      normal VQ scan.   CONSULTATIONS:  None.   HISTORY:  As in H and P notes of March 17, 2008, dictated by Dr.  Della Goo.  However, in brief, this is a 35 year old female, with  no significant past medical history presenting with a 4 day history of  bilateral flank pain associated with fevers, chills, nausea, vomiting,  but no diarrhea.   This was associated with urinary frequency.  She  reported to the emergency department at [facility] where a CT  scan of the abdomen and pelvis revealed bilateral perinephric stranding.  She was started on empiric antibiotic therapy with Ciprofloxacin,  commenced on intravenous fluids and referred to [facility] for  admission, further evaluation, investigation and management.   CLINICAL COURSE:  1. Acute pyelonephritis.  For details of presentation, refer to      admission history above.  The patient's white cell count was      elevated at 17.9.  She had a positive urinary sediment consistent      with urinary tract infection and subsequent urine culture grew over      100,000 colonies of E. coli, which was pansensitive.  She responded      to intravenous Ciprofloxacin treatment as well as intravenous fluid      hydration and by March 20, 2008, her white cell  count normalized at      10.9, and the patient felt considerably better.  Vomiting had      resolved by day # 2 of hospitalization, although she did have some      residual nausea.  However by March 19, 2008, she was able to      tolerate a regular diet without any deleterious effects,      whatsoever.  As of March 21, 2008, the patient felt considerably      better and was keen to be discharged.   1. Dehydration/acute renal failure.  The patient at the time of      initial evaluation, was found to have a BUN of 29 with a creatinine      of 2.5.  She was managed with intravenous fluid hydration.  We are      pleased note that as of March 21, 2008, BUN was 9 with a creatinine      of 1.33.  Further improvement in creatinine levels anticipated.   1. Mild fluid overload.  The patient on March 19, 2008 was found to be      mildly hypoxic.  Fine basilar crackles were noted on chest      auscultation, although the patient was having good urinary output,      this raised a suspicion of fluid overload.  Her intravenous  fluid      rate was decreased.  However at night on March 19, 2008, night      cover MD was paged because the patient had desaturated      significantly to 78% on 2 liters of oxygen.  This was addressed      with administration of intravenous Lasix in a dose of 40 mg, with      satisfactory clinical response.  BNP which had initially been      significantly elevated at 946, had by March 21, 2008, dropped down      to 493.  Chest was clinically clear.  For completeness, chest x-ray      was done on March 20, 2008.  This showed pulmonary vascular      congestion, but no overt edema.  This was followed up with a      ventilation-perfusion lung scan, which was reported as a normal      examination.  Be that as it may, by March 21, 2008, the patient was      saturating at 98% on room air and appeared euvolemic.   1. Smoking history.  The patient has been counseled appropriately.   DISPOSITION:  The patient was on March 21, 2008, considered sufficiently  recovered and stable to be discharged.  She was therefore discharged  accordingly.   DIET:  No restrictions.   ACTIVITY:  As tolerated.   FOLLOW UP INSTRUCTIONS:  The patient is to follow up with her primary  MD, Dr. Derrill Center, [place], [facility] routinely, per prior  scheduled appointment.   DISCHARGE INSTRUCTIONS:  The patient is recommended to return to regular  duties on March 27, 2008.      Isidor Holts, M.D.  Electronically Signed     CO/MEDQ  D:  03/21/2008  T:  03/21/2008  Job:  [id number]   cc:   Derrill Center, MD

## 2010-05-21 NOTE — H&P (Signed)
NAMEXITLALY, AULT          ACCOUNT NO.:  [id number]   MEDICAL RECORD NO.:  [id number]          PATIENT TYPE:  INP   LOCATION:  6735                         FACILITY:  [facility]   PHYSICIAN:  Della Goo, M.D. DATE OF BIRTH:  January 02, 1976   DATE OF ADMISSION:  03/17/2008  DATE OF DISCHARGE:                              HISTORY & PHYSICAL   CHIEF COMPLAINT:  Back pain, fever, chills.   PRIMARY CARE PHYSICIAN:  Dr. Derrill Center in [place], [place].   HISTORY OF PRESENT ILLNESS:  This is a 35 year old female who presents  to the emergency department complaints of severe worsening bilateral  area flank pain for the past 4 days.  She reports having fevers, chills,  nausea, vomiting but no diarrhea.  She reports having decreased  urination.  The patient presented to the emergency department at the  [facility] for evaluation.  A CT scan of the abdomen and  pelvis was performed which revealed bilateral perinephric stranding  greater on the right side.  The patient was referred for admission and  started on IV antibiotic therapy of ciprofloxacin and IV fluids.   PAST MEDICAL HISTORY:  None.   MEDICATIONS:  None.   ALLERGIES:  No known drug allergies.   SOCIAL HISTORY:  The patient is a smoker.  She smokes one pack of  cigarettes in a week.  She is a nondrinker.   FAMILY HISTORY:  Noncontributory.   REVIEW OF SYSTEMS:  Pertinents are mentioned above.   PHYSICAL EXAMINATION FINDINGS:  This is a 35 year old, well-nourished,  well-developed female in discomfort but no acute distress.  VITAL SIGNS:  Are temperature 97.8, blood pressure 114/63, heart rate  initially 124 - now 100, respirations 18, O2 saturations 100% on room  air.  HEENT EXAMINATION:  Normocephalic, atraumatic.  Pupils equally round,  reactive to light.  Extraocular movements are intact.  Funduscopic  benign.  Oropharynx is clear.  NECK:  Is supple, full range of motion.  No thyromegaly,  adenopathy or  jugular venous distention.  CARDIOVASCULAR:  Regular rate and rhythm.  No murmurs, gallops or rubs.  LUNGS:  Clear to auscultation bilaterally.  ABDOMEN:  Positive bowel sounds, soft, nontender, nondistended.  EXTREMITIES:  Without cyanosis, clubbing or edema.  BACK EXAMINATION:  Positive costovertebral angle tenderness bilaterally.  NEUROLOGIC EXAMINATION:  Alert and oriented x3.  There are no focal  deficits.   LABORATORY STUDIES:  White blood cell count 17.9, hemoglobin 12.5,  hematocrit 35.7, platelets 124, neutrophils 93%, lymphocytes 4%.  Sodium  131, potassium 2.9, chloride 88, carbon dioxide 25, BUN 29, creatinine  2.5 and glucose 108.   ASSESSMENT:  A 35 year old female being admitted with:  1. Pyelonephritis.  2. Acute renal failure.  3. Mild hypokalemia.  4. Mild hyponatremia.  5. Thrombocytopenia.   PLAN:  The patient will be admitted, placed on IV antibiotic therapy of  ciprofloxacin for now.  A urine culture has been ordered, and pending  these results or worsening of her condition, her antibiotic therapy will  be adjusted.  The patient has been placed on IV fluids for fluid  resuscitation.  Her electrolytes will also be monitored and corrected.  DVT and GI prophylaxis have also been ordered.      Della Goo, M.D.  Electronically Signed     HJ/MEDQ  D:  03/18/2008  T:  03/18/2008  Job:  [id number]

## 2010-05-22 NOTE — Telephone Encounter (Signed)
LMOM for pt to call for OV appt,

## 2010-05-23 ENCOUNTER — Ambulatory Visit: Payer: Self-pay | Admitting: Gastroenterology

## 2010-05-23 NOTE — Telephone Encounter (Signed)
I spoke with pt and she agreed to come in today at 3pm. Patient was a no show.

## 2010-05-23 NOTE — Telephone Encounter (Signed)
Pt has appt for OV today.

## 2010-05-31 ENCOUNTER — Telehealth: Payer: Self-pay

## 2010-05-31 NOTE — Telephone Encounter (Signed)
Pt called and said she was told the medicine for her Gastroparesis would need to be gotten from [place]. She has checked something on line and saw that E-Mycin is used for that also, She would like to know if Dr. Jena Gauss would consider that for her. She uses [facility]. She is aware that it will probably be next week before this is addressed since he is seeing pt's today and we leave at noon.

## 2010-06-03 NOTE — Telephone Encounter (Signed)
E'mycin not first line; would resort to only if domperidone failed

## 2010-06-04 NOTE — Telephone Encounter (Signed)
Pt was informed. Was upset, said you mean he will give me Rx that is not FDA approved but not give me the E-mycin that is approved . I repeated again that he said that he does not chose the E-mycin unless the Domperidone does not work. She said to tell Dr. Jena Gauss that she will be finding her another GI doctor.

## 2010-06-04 NOTE — Telephone Encounter (Signed)
LMOM to call.

## 2010-06-05 NOTE — Telephone Encounter (Signed)
This is her choice.

## 2010-07-02 NOTE — Progress Notes (Signed)
Pt was sent a discharge letter by certified mail- we received letter back- unclaimed.

## 2010-07-02 NOTE — Progress Notes (Signed)
Please have note letter with the proof of refusing to sign, scanned in to the pts chart.

## 2010-09-23 ENCOUNTER — Telehealth: Payer: Self-pay

## 2010-09-23 NOTE — Telephone Encounter (Signed)
I called the pt back and she stated she is living in [place] now and really wants Dr Jena Gauss to call her something in because she can't keep any food down.  I informed the pt that she was d/c from the practice in 05/2010.  She started yelling and cursing on the phone and I had to disconnect the line.

## 2010-09-23 NOTE — Telephone Encounter (Signed)
I answered the phone call and the patient asked for Dr. Jena Gauss and I went to find Raynelle Fanning to get her to the phone but the patient had hung up before Raynelle Fanning could get to the phone. The patient called back and screamed at me asking again for Dr. Jena Gauss and I proceeded to tell her that Dr. Jena Gauss was not here and she screamed at me telling me I should have told her that 45 minutes ago an then I put Raynelle Fanning on the phone

## 2010-09-23 NOTE — Telephone Encounter (Signed)
Pt called- she wanted RMR to call her. I asked pt what it was in reference to and she said she" was down to 100lbs and couldn't keep anything on her stomach and she wanted to know what the hell he was going to do about it." she said he wont see her in the office anymore and "he has until the end of today to call her or she was going to call someone and have his ass shut down". Pt then hung up the phone. Pt was discharged from the practice in May, 2012. pts phone number is [phone]

## 2011-03-07 ENCOUNTER — Other Ambulatory Visit: Payer: Self-pay | Admitting: Adult Health

## 2011-03-07 DIAGNOSIS — N6320 Unspecified lump in the left breast, unspecified quadrant: Secondary | ICD-10-CM

## 2011-03-26 ENCOUNTER — Ambulatory Visit (HOSPITAL_COMMUNITY)
Admission: RE | Admit: 2011-03-26 | Discharge: 2011-03-26 | Disposition: A | Discharge status: Home / Self Care | Payer: Self-pay | Source: Ambulatory Visit | Attending: Adult Health | Admitting: Adult Health

## 2011-03-26 ENCOUNTER — Other Ambulatory Visit: Payer: Self-pay | Admitting: Adult Health

## 2011-03-26 DIAGNOSIS — N6320 Unspecified lump in the left breast, unspecified quadrant: Secondary | ICD-10-CM

## 2011-03-26 DIAGNOSIS — N63 Unspecified lump in unspecified breast: Secondary | ICD-10-CM | POA: Insufficient documentation

## 2016-07-29 ENCOUNTER — Inpatient Hospital Stay (HOSPITAL_BASED_OUTPATIENT_CLINIC_OR_DEPARTMENT_OTHER)
Admission: EM | Admit: 2016-07-29 | Discharge: 2016-08-10 | DRG: 338 | Disposition: A | Discharge status: Home / Self Care | Payer: Self-pay | Attending: General Surgery | Admitting: General Surgery

## 2016-07-29 ENCOUNTER — Encounter (HOSPITAL_BASED_OUTPATIENT_CLINIC_OR_DEPARTMENT_OTHER): Payer: Self-pay

## 2016-07-29 ENCOUNTER — Emergency Department (HOSPITAL_BASED_OUTPATIENT_CLINIC_OR_DEPARTMENT_OTHER): Payer: Self-pay

## 2016-07-29 DIAGNOSIS — N733 Female acute pelvic peritonitis: Secondary | ICD-10-CM | POA: Diagnosis not present

## 2016-07-29 DIAGNOSIS — N739 Female pelvic inflammatory disease, unspecified: Secondary | ICD-10-CM

## 2016-07-29 DIAGNOSIS — K7200 Acute and subacute hepatic failure without coma: Secondary | ICD-10-CM | POA: Diagnosis not present

## 2016-07-29 DIAGNOSIS — R1110 Vomiting, unspecified: Secondary | ICD-10-CM

## 2016-07-29 DIAGNOSIS — K3184 Gastroparesis: Secondary | ICD-10-CM | POA: Diagnosis present

## 2016-07-29 DIAGNOSIS — G9340 Encephalopathy, unspecified: Secondary | ICD-10-CM | POA: Diagnosis present

## 2016-07-29 DIAGNOSIS — D689 Coagulation defect, unspecified: Secondary | ICD-10-CM | POA: Diagnosis not present

## 2016-07-29 DIAGNOSIS — Z87891 Personal history of nicotine dependence: Secondary | ICD-10-CM

## 2016-07-29 DIAGNOSIS — T391X5A Adverse effect of 4-Aminophenol derivatives, initial encounter: Secondary | ICD-10-CM | POA: Diagnosis not present

## 2016-07-29 DIAGNOSIS — E876 Hypokalemia: Secondary | ICD-10-CM | POA: Diagnosis present

## 2016-07-29 DIAGNOSIS — D649 Anemia, unspecified: Secondary | ICD-10-CM | POA: Diagnosis present

## 2016-07-29 DIAGNOSIS — Z79899 Other long term (current) drug therapy: Secondary | ICD-10-CM

## 2016-07-29 DIAGNOSIS — K353 Acute appendicitis with localized peritonitis, without perforation or gangrene: Secondary | ICD-10-CM

## 2016-07-29 DIAGNOSIS — F329 Major depressive disorder, single episode, unspecified: Secondary | ICD-10-CM | POA: Diagnosis present

## 2016-07-29 DIAGNOSIS — K37 Unspecified appendicitis: Secondary | ICD-10-CM | POA: Diagnosis present

## 2016-07-29 DIAGNOSIS — K3580 Unspecified acute appendicitis: Secondary | ICD-10-CM | POA: Diagnosis present

## 2016-07-29 DIAGNOSIS — R6520 Severe sepsis without septic shock: Secondary | ICD-10-CM | POA: Diagnosis not present

## 2016-07-29 DIAGNOSIS — E875 Hyperkalemia: Secondary | ICD-10-CM | POA: Diagnosis not present

## 2016-07-29 DIAGNOSIS — A419 Sepsis, unspecified organism: Secondary | ICD-10-CM | POA: Diagnosis not present

## 2016-07-29 DIAGNOSIS — R4182 Altered mental status, unspecified: Secondary | ICD-10-CM

## 2016-07-29 DIAGNOSIS — T814XXA Infection following a procedure, initial encounter: Secondary | ICD-10-CM | POA: Diagnosis not present

## 2016-07-29 DIAGNOSIS — R188 Other ascites: Secondary | ICD-10-CM

## 2016-07-29 DIAGNOSIS — R109 Unspecified abdominal pain: Secondary | ICD-10-CM

## 2016-07-29 DIAGNOSIS — Z0189 Encounter for other specified special examinations: Secondary | ICD-10-CM

## 2016-07-29 LAB — URINALYSIS, ROUTINE W REFLEX MICROSCOPIC
Glucose, UA: NEGATIVE mg/dL
Hgb urine dipstick: NEGATIVE
Ketones, ur: >80 mg/dL — AB
NITRITE: NEGATIVE
PH: 6 (ref 5.0–8.0)
Protein, ur: NEGATIVE mg/dL
SPECIFIC GRAVITY, URINE: 1.017 (ref 1.005–1.030)

## 2016-07-29 LAB — COMPREHENSIVE METABOLIC PANEL
ALBUMIN: 3.9 g/dL (ref 3.5–5.0)
ALK PHOS: 84 U/L (ref 38–126)
ALT: 8 U/L — AB (ref 14–54)
ANION GAP: 15 (ref 5–15)
AST: 13 U/L — AB (ref 15–41)
BUN: 10 mg/dL (ref 6–20)
CALCIUM: 8.5 mg/dL — AB (ref 8.9–10.3)
CO2: 19 mmol/L — AB (ref 22–32)
CREATININE: 0.99 mg/dL (ref 0.44–1.00)
Chloride: 103 mmol/L (ref 101–111)
GFR calc Af Amer: >60 mL/min (ref >60)
GFR calc non Af Amer: >60 mL/min (ref >60)
GLUCOSE: 81 mg/dL (ref 65–99)
Potassium: 3.7 mmol/L (ref 3.5–5.1)
SODIUM: 137 mmol/L (ref 135–145)
Total Bilirubin: 0.7 mg/dL (ref 0.3–1.2)
Total Protein: 7.2 g/dL (ref 6.5–8.1)

## 2016-07-29 LAB — CBC WITH DIFFERENTIAL/PLATELET
Basophils Absolute: 0 10*3/uL (ref 0.0–0.1)
Basophils Relative: 0 %
EOS ABS: 0 10*3/uL (ref 0.0–0.7)
Eosinophils Relative: 0 %
HCT: 35.8 % — ABNORMAL LOW (ref 36.0–46.0)
HEMOGLOBIN: 12 g/dL (ref 12.0–15.0)
LYMPHS ABS: 0.8 10*3/uL (ref 0.7–4.0)
Lymphocytes Relative: 7 %
MCH: 32.2 pg (ref 26.0–34.0)
MCHC: 33.5 g/dL (ref 30.0–36.0)
MCV: 96 fL (ref 78.0–100.0)
MONO ABS: 0.6 10*3/uL (ref 0.1–1.0)
MONOS PCT: 5 %
NEUTROS PCT: 88 %
Neutro Abs: 10.8 10*3/uL — ABNORMAL HIGH (ref 1.7–7.7)
Platelets: 294 10*3/uL (ref 150–400)
RBC: 3.73 MIL/uL — ABNORMAL LOW (ref 3.87–5.11)
RDW: 14.1 % (ref 11.5–15.5)
WBC: 12.3 10*3/uL — ABNORMAL HIGH (ref 4.0–10.5)

## 2016-07-29 LAB — URINALYSIS, MICROSCOPIC (REFLEX)

## 2016-07-29 LAB — LIPASE, BLOOD: Lipase: 16 U/L (ref 11–51)

## 2016-07-29 LAB — PREGNANCY, URINE: Preg Test, Ur: NEGATIVE

## 2016-07-29 LAB — I-STAT CG4 LACTIC ACID, ED: LACTIC ACID, VENOUS: 1.82 mmol/L (ref 0.5–1.9)

## 2016-07-29 MED ORDER — SODIUM CHLORIDE 0.9 % IV SOLN
1000.0000 mL | INTRAVENOUS | Status: DC
  Administered 2016-07-29 (×2): 1000 mL via INTRAVENOUS

## 2016-07-29 MED ORDER — SODIUM CHLORIDE 0.9 % IV BOLUS (SEPSIS)
1000.0000 mL | Freq: Once | INTRAVENOUS | Status: AC
  Administered 2016-07-29: 1000 mL via INTRAVENOUS

## 2016-07-29 MED ORDER — IOPAMIDOL (ISOVUE-300) INJECTION 61%
100.0000 mL | Freq: Once | INTRAVENOUS | Status: AC | PRN
  Administered 2016-07-29: 100 mL via INTRAVENOUS

## 2016-07-29 MED ORDER — MORPHINE SULFATE (PF) 4 MG/ML IV SOLN
4.0000 mg | Freq: Once | INTRAVENOUS | Status: AC
  Administered 2016-07-29: 4 mg via INTRAVENOUS
  Filled 2016-07-29: qty 1

## 2016-07-29 MED ORDER — SODIUM CHLORIDE 0.9 % IV SOLN: INTRAVENOUS | Status: AC

## 2016-07-29 MED ORDER — ONDANSETRON HCL 4 MG/2ML IJ SOLN
4.0000 mg | Freq: Once | INTRAMUSCULAR | Status: AC
  Administered 2016-07-29: 4 mg via INTRAVENOUS
  Filled 2016-07-29: qty 2

## 2016-07-29 MED ORDER — ACETAMINOPHEN 325 MG PO TABS
650.0000 mg | ORAL_TABLET | Freq: Once | ORAL | Status: AC
  Administered 2016-07-29: 650 mg via ORAL
  Filled 2016-07-29: qty 2

## 2016-07-29 MED ORDER — HYDROMORPHONE HCL-NACL 0.5-0.9 MG/ML-% IV SOSY
1.0000 mg | PREFILLED_SYRINGE | INTRAVENOUS | Status: DC | PRN
  Administered 2016-07-29: 1 mg via INTRAVENOUS
  Filled 2016-07-29: qty 2

## 2016-07-29 MED ORDER — PIPERACILLIN-TAZOBACTAM 3.375 G IVPB 30 MIN
3.3750 g | Freq: Once | INTRAVENOUS | Status: AC
  Administered 2016-07-29: 3.375 g via INTRAVENOUS
  Filled 2016-07-29 (×2): qty 50

## 2016-07-29 MED ORDER — ONDANSETRON HCL 4 MG/2ML IJ SOLN
4.0000 mg | Freq: Three times a day (TID) | INTRAMUSCULAR | Status: DC | PRN
Start: 2016-07-29 — End: 2016-07-29
  Administered 2016-07-29: 4 mg via INTRAVENOUS
  Filled 2016-07-29: qty 2

## 2016-07-29 MED ORDER — HYDROMORPHONE HCL 1 MG/ML IJ SOLN
1.0000 mg | Freq: Once | INTRAMUSCULAR | Status: AC
  Administered 2016-07-29: 1 mg via INTRAVENOUS
  Filled 2016-07-29: qty 1

## 2016-07-29 NOTE — H&P (Signed)
Valerie Mitchell is an 41 y.o. female.    Chief Complaint: Abdominal pain  HPI: Patient is a generally healthy 41 year old female who 3-4 days ago developed the gradual onset of lower abdominal pain. She describes constant aching pain across her lower abdomen. She was starting her menstrual period at that time and attributed the pain to this. However as she finished her menstrual period the pain persisted and has gradually worsened. She describes increasing aching pain across her lower abdomen radiating up to her epigastrium. Constant and worse with motion. She has been nauseated without vomiting. She has noted fever and some chills the last 24-48 hours. She gets occasional sharp stabbing pain in the right lower quadrant with motion. Bowel movements normal. No history of previous similar symptoms or chronic GI complaints. She presented to the [facility] [place] where CT scan was performed consistent with appendicitis as below.  Past Medical History:  Diagnosis Date  . Depression   . Pancreatitis 11/2008   likely r/t ETOH  . Pyelonephritis 2010  . Renal insufficiency     Past Surgical History:  Procedure Laterality Date  . EGD, Dr. Trevor Iha [facility], bile-stained gastric mucosa  03/08/10  . EUS, Dr. Lowella Curb chronic pancreatitis, duodenal erosion, gb adenomyotosis  01/10/09  . LAPAROSCOPIC CHOLECYSTECTOMY  11/11  . TCS, Dr. Vivi Ferns colon and TI  02/14/10    Family History  Problem Relation Age of Onset  . Colon cancer Maternal Grandfather   . Stroke Mother    Social History:  reports that she quit smoking about 3 years ago. Her smoking use included Cigarettes. She has a 16.00 pack-year smoking history. She has never used smokeless tobacco. She reports that she does not drink alcohol or use drugs.  Allergies: No Known Allergies  Medications Prior to Admission  Medication Sig Dispense Refill  . citalopram (CELEXA) 10 MG tablet Take 10 mg by mouth daily.      Marland Kitchen  HYDROcodone-acetaminophen (VICODIN) 5-500 MG per tablet TAKE 1 TABLET EVERY SIX HOURS AS NEEDED FOR PAIN 30 tablet 0  . promethazine (PHENERGAN) 25 MG tablet TAKE 1 TABLET EVERY SIX HOURS AS NEEDED FOR NAUSEA 20 tablet 0    Results for orders placed or performed during the hospital encounter of 07/29/16 (from the past 48 hour(s))  Urinalysis, Routine w reflex microscopic     Status: Abnormal   Collection Time: 07/29/16  5:26 PM  Result Value Ref Range   Color, Urine YELLOW YELLOW   APPearance CLOUDY (A) CLEAR   Specific Gravity, Urine 1.017 1.005 - 1.030   pH 6.0 5.0 - 8.0   Glucose, UA NEGATIVE NEGATIVE mg/dL   Hgb urine dipstick NEGATIVE NEGATIVE   Bilirubin Urine MODERATE (A) NEGATIVE   Ketones, ur >80 (A) NEGATIVE mg/dL   Protein, ur NEGATIVE NEGATIVE mg/dL   Nitrite NEGATIVE NEGATIVE   Leukocytes, UA MODERATE (A) NEGATIVE  Pregnancy, urine     Status: None   Collection Time: 07/29/16  5:26 PM  Result Value Ref Range   Preg Test, Ur NEGATIVE NEGATIVE    Comment:        THE SENSITIVITY OF THIS METHODOLOGY IS >20 mIU/mL.   Urinalysis, Microscopic (reflex)     Status: Abnormal   Collection Time: 07/29/16  5:26 PM  Result Value Ref Range   RBC / HPF 0-5 0 - 5 RBC/hpf   WBC, UA 6-30 0 - 5 WBC/hpf   Bacteria, UA RARE (A) NONE SEEN   Squamous Epithelial / LPF  6-30 (A) NONE SEEN   Mucous PRESENT   Comprehensive metabolic panel     Status: Abnormal   Collection Time: 07/29/16  7:30 PM  Result Value Ref Range   Sodium 137 135 - 145 mmol/L   Potassium 3.7 3.5 - 5.1 mmol/L   Chloride 103 101 - 111 mmol/L   CO2 19 (L) 22 - 32 mmol/L   Glucose, Bld 81 65 - 99 mg/dL   BUN 10 6 - 20 mg/dL   Creatinine, Ser 0.99 0.44 - 1.00 mg/dL   Calcium 8.5 (L) 8.9 - 10.3 mg/dL   Total Protein 7.2 6.5 - 8.1 g/dL   Albumin 3.9 3.5 - 5.0 g/dL   AST 13 (L) 15 - 41 U/L   ALT 8 (L) 14 - 54 U/L   Alkaline Phosphatase 84 38 - 126 U/L   Total Bilirubin 0.7 0.3 - 1.2 mg/dL   GFR calc non Af Amer >60  >60 mL/min   GFR calc Af Amer >60 >60 mL/min    Comment: (NOTE) The eGFR has been calculated using the CKD EPI equation. This calculation has not been validated in all clinical situations. eGFR's persistently <60 mL/min signify possible Chronic Kidney Disease.    Anion gap 15 5 - 15  Lipase, blood     Status: None   Collection Time: 07/29/16  7:30 PM  Result Value Ref Range   Lipase 16 11 - 51 U/L  CBC with Differential     Status: Abnormal   Collection Time: 07/29/16  7:30 PM  Result Value Ref Range   WBC 12.3 (H) 4.0 - 10.5 K/uL   RBC 3.73 (L) 3.87 - 5.11 MIL/uL   Hemoglobin 12.0 12.0 - 15.0 g/dL   HCT 35.8 (L) 36.0 - 46.0 %   MCV 96.0 78.0 - 100.0 fL   MCH 32.2 26.0 - 34.0 pg   MCHC 33.5 30.0 - 36.0 g/dL   RDW 14.1 11.5 - 15.5 %   Platelets 294 150 - 400 K/uL   Neutrophils Relative % 88 %   Neutro Abs 10.8 (H) 1.7 - 7.7 K/uL   Lymphocytes Relative 7 %   Lymphs Abs 0.8 0.7 - 4.0 K/uL   Monocytes Relative 5 %   Monocytes Absolute 0.6 0.1 - 1.0 K/uL   Eosinophils Relative 0 %   Eosinophils Absolute 0.0 0.0 - 0.7 K/uL   Basophils Relative 0 %   Basophils Absolute 0.0 0.0 - 0.1 K/uL  I-Stat CG4 Lactic Acid, ED     Status: None   Collection Time: 07/29/16  7:47 PM  Result Value Ref Range   Lactic Acid, Venous 1.82 0.5 - 1.9 mmol/L   Ct Abdomen Pelvis W Contrast  Result Date: 07/29/2016 CLINICAL DATA:  Lower abdomen and pelvic pain for 3 days with nausea EXAM: CT ABDOMEN AND PELVIS WITH CONTRAST TECHNIQUE: Multidetector CT imaging of the abdomen and pelvis was performed using the standard protocol following bolus administration of intravenous contrast. CONTRAST:  140m ISOVUE-300 IOPAMIDOL (ISOVUE-300) INJECTION 61% COMPARISON:  01/29/2010, CT 06/01/2009 FINDINGS: Lower chest: Lung bases demonstrate no acute consolidation or pleural effusion. Normal heart size. Hepatobiliary: No focal hepatic abnormality. Surgical clips in the gallbladder fossa. No biliary dilatation Pancreas:  Unremarkable. No pancreatic ductal dilatation or surrounding inflammatory changes. Spleen: Normal in size without focal abnormality. Adrenals/Urinary Tract: Adrenal glands are within normal limits. Subcentimeter hypodensities in the kidneys too small to further characterize. Multiple punctate stones in the right kidney. Multiple small stones in the left kidney,  largest is seen in the upper pole and measures 3 mm. Bladder negative. Stomach/Bowel: Stomach within normal limits. No dilated small bowel. Significant right lower quadrant inflammation. Markedly dilated appendix, measuring up to 2 cm with hyperdensity in the proximal lumen measuring 2 cm, possibly a faintly calcified stone. No extraluminal gas collection. Vascular/Lymphatic: Non aneurysmal aorta. No significantly enlarged lymph nodes. Reproductive: Uterus and bilateral adnexa are unremarkable. 15 mm cyst left adnexa Other: No free air.  Small amount of free fluid in the pelvis Musculoskeletal: No acute or significant osseous findings. IMPRESSION: 1. Markedly enlarged appendix up to 2 cm with oval intraluminal density measuring up to 2 cm suspicious for an appendicolith. Significant surrounding inflammatory changes in the right lower quadrant and the findings are consistent with an acute appendicitis. No extraluminal gas collections at this time. No evidence for a pelvic abscess. 2. Multiple nonobstructing stones within the bilateral kidneys. Electronically Signed   By: Donavan Foil M.D.   On: 07/29/2016 20:46    Review of Systems  Constitutional: Positive for chills, fever and malaise/fatigue.  Respiratory: Negative.   Cardiovascular: Negative.   Gastrointestinal: Positive for abdominal pain and nausea. Negative for blood in stool, constipation, diarrhea, melena and vomiting.  Genitourinary: Negative.     Blood pressure 117/64, pulse 91, temperature 98.5 F (36.9 C), temperature source Oral, resp. rate 18, height 5' 7"  (1.702 m), weight 65.8 kg  (145 lb), last menstrual period 07/22/2016, SpO2 97 %. Physical Exam  General: Alert, well-developed pleasant Caucasian female who appears uncomfortable Skin: Warm and dry without rash or infection. HEENT: No palpable masses or thyromegaly. Sclera nonicteric. Pupils equal round and reactive. Lymph nodes: No cervical, supraclavicular, or inguinal nodes palpable. Lungs: Breath sounds clear and equal without increased work of breathing Cardiovascular: Regular rate and rhythm without murmur. No JVD or edema. Peripheral pulses intact. Abdomen: Nondistended. Bowel sounds present. There is tenderness across the lower abdomen but most marked in the right lower quadrant with guarding. Mild diffuse tenderness.  Extremities: No edema or joint swelling or deformity. No chronic venous stasis changes. Neurologic: Alert and fully oriented. No gross motor or sensory deficits. Affect normal.  Assessment/Plan Gradual onset of lower abdominal pain 3-4 days ago associated with nausea and fever. CT scan shows evidence of significant appendicitis although no evidence of perforation or abscess. Patient is being admitted and started on broad-spectrum IV antibiotics, IV fluids and pain medication. Plan urgent laparoscopic cholecystectomy. I discussed the procedure with the patient including its nature and indications, expected recovery and risks of open procedure, anesthetic complications, infection and bleeding.  Edward Jolly, MD 07/29/2016, 11:40 PM

## 2016-07-29 NOTE — ED Provider Notes (Signed)
[facility] Provider Note   CSN: [id number] Arrival date & time: 07/29/16  1711     History   Chief Complaint Chief Complaint  Patient presents with  . Abdominal Pain    HPI Valerie Mitchell is a 41 y.o. female.  HPI Patient has had diffuse lower abdominal pain for 2-3 days. She indicates that started fairly centrally and has stayed on both sides of her lower abdomen. She reports she had developed a fever and chills. The patient reports she's been very nauseated but has been taking Zofran and not actually vomited. No pain or burning with urination. No abnormal vaginal discharge or bleeding. Past Medical History:  Diagnosis Date  . Depression   . Pancreatitis 11/2008   likely r/t ETOH  . Pyelonephritis 2010  . Renal insufficiency     Patient Active Problem List   Diagnosis Date Noted  . Appendicitis 07/29/2016  . Gastroparesis 03/27/2010  . Chronic epigastric pain 03/27/2010  . IBS (irritable bowel syndrome) 03/27/2010  . Chronic diarrhea 03/27/2010  . Normocytic anemia 03/27/2010  . Transaminitis 03/27/2010  . Elevated serum creatinine 03/27/2010  . HEMATOCHEZIA 02/12/2010  . ABDOMINAL PAIN, LOWER 02/12/2010    Past Surgical History:  Procedure Laterality Date  . EGD, Dr. Ronni Rumble [facility], bile-stained gastric mucosa  03/08/10  . EUS, Dr. Sonda Primes chronic pancreatitis, duodenal erosion, gb adenomyotosis  01/10/09  . LAPAROSCOPIC CHOLECYSTECTOMY  11/11  . TCS, Dr. Elmer Ramp colon and TI  02/14/10    OB History    No data available       Home Medications    Prior to Admission medications   Medication Sig Start Date End Date Taking? Authorizing Provider  citalopram (CELEXA) 10 MG tablet Take 10 mg by mouth daily.      [provider]  HYDROcodone-acetaminophen (VICODIN) 5-500 MG per tablet TAKE 1 TABLET EVERY SIX HOURS AS NEEDED FOR PAIN 04/02/10   Joselyn Arrow, NP  promethazine (PHENERGAN) 25 MG tablet TAKE 1 TABLET  EVERY SIX HOURS AS NEEDED FOR NAUSEA 05/02/10   Tiffany Kocher, PA-C    Family History Family History  Problem Relation Age of Onset  . Colon cancer Maternal Grandfather   . Stroke Mother     Social History Social History  Substance Use Topics  . Smoking status: Current Every Day Smoker    Packs/day: 0.80    Years: 20.00    Types: Cigarettes  . Smokeless tobacco: Never Used  . Alcohol use No     Allergies   Patient has no known allergies.   Review of Systems Review of Systems 10 Systems reviewed and are negative for acute change except as noted in the HPI.  Physical Exam Updated Vital Signs BP (!) 116/55   Pulse 99   Temp 100.2 F (37.9 C) (Oral)   Resp 20   Ht 5\' 7"  (1.702 m)   Wt 65.8 kg (145 lb)   LMP 07/22/2016   SpO2 100%   BMI 22.71 kg/m   Physical Exam  Constitutional: She is oriented to person, place, and time. She appears well-developed and well-nourished. No distress.  Patient appears in pain. She is alert and nontoxic. No respiratory distress.  HENT:  Head: Normocephalic and atraumatic.  Eyes: Conjunctivae and EOM are normal.  Neck: Neck supple.  Cardiovascular: Normal rate, regular rhythm, normal heart sounds and intact distal pulses.   No murmur heard. Pulmonary/Chest: Effort normal and breath sounds normal. No respiratory distress.  Abdominal: Soft. Bowel  sounds are normal. There is tenderness.  Patient lower abdomen is very tender to palpation. She is very tender both on the left and the right with guarding. Upper abdomen nontender.  Musculoskeletal: Normal range of motion. She exhibits no edema or tenderness.  Neurological: She is alert and oriented to person, place, and time. No cranial nerve deficit. She exhibits normal muscle tone. Coordination normal.  Skin: Skin is warm and dry.  Psychiatric: She has a normal mood and affect.  Nursing note and vitals reviewed.    ED Treatments / Results  Labs (all labs ordered are listed, but only  abnormal results are displayed) Labs Reviewed  URINALYSIS, ROUTINE W REFLEX MICROSCOPIC - Abnormal; Notable for the following:       Result Value   APPearance CLOUDY (*)    Bilirubin Urine MODERATE (*)    Ketones, ur >80 (*)    Leukocytes, UA MODERATE (*)    All other components within normal limits  URINALYSIS, MICROSCOPIC (REFLEX) - Abnormal; Notable for the following:    Bacteria, UA RARE (*)    Squamous Epithelial / LPF 6-30 (*)    All other components within normal limits  COMPREHENSIVE METABOLIC PANEL - Abnormal; Notable for the following:    CO2 19 (*)    Calcium 8.5 (*)    AST 13 (*)    ALT 8 (*)    All other components within normal limits  CBC WITH DIFFERENTIAL/PLATELET - Abnormal; Notable for the following:    WBC 12.3 (*)    RBC 3.73 (*)    HCT 35.8 (*)    Neutro Abs 10.8 (*)    All other components within normal limits  URINE CULTURE  PREGNANCY, URINE  LIPASE, BLOOD  I-STAT CG4 LACTIC ACID, ED    EKG  EKG Interpretation None       Radiology Ct Abdomen Pelvis W Contrast  Result Date: 07/29/2016 CLINICAL DATA:  Lower abdomen and pelvic pain for 3 days with nausea EXAM: CT ABDOMEN AND PELVIS WITH CONTRAST TECHNIQUE: Multidetector CT imaging of the abdomen and pelvis was performed using the standard protocol following bolus administration of intravenous contrast. CONTRAST:  100mL ISOVUE-300 IOPAMIDOL (ISOVUE-300) INJECTION 61% COMPARISON:  01/29/2010, CT 06/01/2009 FINDINGS: Lower chest: Lung bases demonstrate no acute consolidation or pleural effusion. Normal heart size. Hepatobiliary: No focal hepatic abnormality. Surgical clips in the gallbladder fossa. No biliary dilatation Pancreas: Unremarkable. No pancreatic ductal dilatation or surrounding inflammatory changes. Spleen: Normal in size without focal abnormality. Adrenals/Urinary Tract: Adrenal glands are within normal limits. Subcentimeter hypodensities in the kidneys too small to further characterize. Multiple  punctate stones in the right kidney. Multiple small stones in the left kidney, largest is seen in the upper pole and measures 3 mm. Bladder negative. Stomach/Bowel: Stomach within normal limits. No dilated small bowel. Significant right lower quadrant inflammation. Markedly dilated appendix, measuring up to 2 cm with hyperdensity in the proximal lumen measuring 2 cm, possibly a faintly calcified stone. No extraluminal gas collection. Vascular/Lymphatic: Non aneurysmal aorta. No significantly enlarged lymph nodes. Reproductive: Uterus and bilateral adnexa are unremarkable. 15 mm cyst left adnexa Other: No free air.  Small amount of free fluid in the pelvis Musculoskeletal: No acute or significant osseous findings. IMPRESSION: 1. Markedly enlarged appendix up to 2 cm with oval intraluminal density measuring up to 2 cm suspicious for an appendicolith. Significant surrounding inflammatory changes in the right lower quadrant and the findings are consistent with an acute appendicitis. No extraluminal gas collections at this  time. No evidence for a pelvic abscess. 2. Multiple nonobstructing stones within the bilateral kidneys. Electronically Signed   By: Jasmine Pang M.D.   On: 07/29/2016 20:46    Procedures Procedures (including critical care time)  Medications Ordered in ED Medications  sodium chloride 0.9 % bolus 1,000 mL (0 mLs Intravenous Stopped 07/29/16 2111)    Followed by  sodium chloride 0.9 % bolus 1,000 mL (0 mLs Intravenous Stopped 07/29/16 2111)    Followed by  0.9 %  sodium chloride infusion (1,000 mLs Intravenous New Bag/Given 07/29/16 2112)  piperacillin-tazobactam (ZOSYN) IVPB 3.375 g (3.375 g Intravenous New Bag/Given 07/29/16 2115)  HYDROmorphone (DILAUDID) injection 1 mg (not administered)  ondansetron (ZOFRAN) injection 4 mg (not administered)  morphine 4 MG/ML injection 4 mg (4 mg Intravenous Given 07/29/16 1950)  ondansetron (ZOFRAN) injection 4 mg (4 mg Intravenous Given 07/29/16 1950)    acetaminophen (TYLENOL) tablet 650 mg (650 mg Oral Given 07/29/16 1950)  iopamidol (ISOVUE-300) 61 % injection 100 mL (100 mLs Intravenous Contrast Given 07/29/16 2015)     Initial Impression / Assessment and Plan / ED Course  I have reviewed the triage vital signs and the nursing notes.  Pertinent labs & imaging results that were available during my care of the patient were reviewed by me and considered in my medical decision making (see chart for details).     Consult: Reviewed Dr. Johna Sheriff for admission.  Final Clinical Impressions(s) / ED Diagnoses   Final diagnoses:  Acute appendicitis with localized peritonitis  Patient presents with lower abdominal pain for 2-3 days. She has diffuse tenderness to palpation. CT shows appendicitis. She started on Zosyn with consultation to Gen. surgery for admission and definitive treatment.  New Prescriptions New Prescriptions   No medications on file     Arby Barrette, MD 07/29/16 2138

## 2016-07-29 NOTE — ED Triage Notes (Signed)
Pt reports diffuse lower abdominal pain x 2-3 days. Nausea, no vomiting. Fever and chills. Dysuria.

## 2016-07-29 NOTE — ED Notes (Signed)
Pt reports last nausea medication and febrile medication at 130 this afternoon.

## 2016-07-30 ENCOUNTER — Inpatient Hospital Stay (HOSPITAL_COMMUNITY): Payer: Self-pay | Admitting: Anesthesiology

## 2016-07-30 ENCOUNTER — Encounter (HOSPITAL_COMMUNITY): Payer: Self-pay | Admitting: Certified Registered Nurse Anesthetist

## 2016-07-30 ENCOUNTER — Encounter (HOSPITAL_COMMUNITY): Admission: EM | Disposition: A | Discharge status: Home / Self Care | Payer: Self-pay | Source: Home / Self Care

## 2016-07-30 DIAGNOSIS — K3580 Unspecified acute appendicitis: Secondary | ICD-10-CM | POA: Diagnosis present

## 2016-07-30 HISTORY — PX: LAPAROSCOPIC APPENDECTOMY: SHX408

## 2016-07-30 LAB — SURGICAL PCR SCREEN
MRSA, PCR: NEGATIVE
STAPHYLOCOCCUS AUREUS: POSITIVE — AB

## 2016-07-30 LAB — HIV ANTIBODY (ROUTINE TESTING W REFLEX): HIV Screen 4th Generation wRfx: NONREACTIVE

## 2016-07-30 SURGERY — APPENDECTOMY, LAPAROSCOPIC
Anesthesia: General | Site: Abdomen

## 2016-07-30 MED ORDER — FENTANYL CITRATE (PF) 100 MCG/2ML IJ SOLN
25.0000 ug | INTRAMUSCULAR | Status: DC | PRN
  Administered 2016-07-30: 50 ug via INTRAVENOUS
  Administered 2016-07-30: 25 ug via INTRAVENOUS
  Administered 2016-07-30: 50 ug via INTRAVENOUS
  Administered 2016-07-30: 25 ug via INTRAVENOUS

## 2016-07-30 MED ORDER — MEPERIDINE HCL 50 MG/ML IJ SOLN: 6.2500 mg | INTRAMUSCULAR | Status: DC | PRN

## 2016-07-30 MED ORDER — LACTATED RINGERS IV SOLN
INTRAVENOUS | Status: AC | PRN
  Administered 2016-07-30: 2000 mL via INTRAVENOUS

## 2016-07-30 MED ORDER — FENTANYL CITRATE (PF) 100 MCG/2ML IJ SOLN
INTRAMUSCULAR | Status: AC
  Administered 2016-07-30: 50 ug via INTRAVENOUS
  Filled 2016-07-30: qty 2

## 2016-07-30 MED ORDER — SUGAMMADEX SODIUM 200 MG/2ML IV SOLN
INTRAVENOUS | Status: DC | PRN
  Administered 2016-07-30: 200 mg via INTRAVENOUS

## 2016-07-30 MED ORDER — OXYCODONE HCL 5 MG PO TABS
5.0000 mg | ORAL_TABLET | ORAL | Status: DC | PRN
  Administered 2016-07-30 – 2016-08-01 (×6): 10 mg via ORAL
  Filled 2016-07-30 (×6): qty 2

## 2016-07-30 MED ORDER — ONDANSETRON HCL 4 MG/2ML IJ SOLN
4.0000 mg | Freq: Four times a day (QID) | INTRAMUSCULAR | Status: DC | PRN
  Administered 2016-07-30 (×2): 4 mg via INTRAVENOUS
  Filled 2016-07-30 (×2): qty 2

## 2016-07-30 MED ORDER — ROCURONIUM BROMIDE 100 MG/10ML IV SOLN
INTRAVENOUS | Status: DC | PRN
  Administered 2016-07-30: 35 mg via INTRAVENOUS

## 2016-07-30 MED ORDER — FENTANYL CITRATE (PF) 100 MCG/2ML IJ SOLN
INTRAMUSCULAR | Status: AC
  Filled 2016-07-30: qty 2

## 2016-07-30 MED ORDER — SUCCINYLCHOLINE CHLORIDE 200 MG/10ML IV SOSY
PREFILLED_SYRINGE | INTRAVENOUS | Status: AC
  Filled 2016-07-30: qty 10

## 2016-07-30 MED ORDER — LIDOCAINE HCL (CARDIAC) 20 MG/ML IV SOLN
INTRAVENOUS | Status: DC | PRN
  Administered 2016-07-30: 50 mg via INTRATRACHEAL

## 2016-07-30 MED ORDER — HEPARIN SODIUM (PORCINE) 5000 UNIT/ML IJ SOLN
5000.0000 [IU] | Freq: Once | INTRAMUSCULAR | Status: AC
  Administered 2016-07-30: 5000 [IU] via SUBCUTANEOUS
  Filled 2016-07-30: qty 1

## 2016-07-30 MED ORDER — KETOROLAC TROMETHAMINE 30 MG/ML IJ SOLN
INTRAMUSCULAR | Status: DC | PRN
  Administered 2016-07-30: 30 mg via INTRAVENOUS

## 2016-07-30 MED ORDER — DEXAMETHASONE SODIUM PHOSPHATE 10 MG/ML IJ SOLN
INTRAMUSCULAR | Status: DC | PRN
  Administered 2016-07-30: 10 mg via INTRAVENOUS

## 2016-07-30 MED ORDER — FENTANYL CITRATE (PF) 100 MCG/2ML IJ SOLN
INTRAMUSCULAR | Status: DC | PRN
  Administered 2016-07-30 (×2): 100 ug via INTRAVENOUS

## 2016-07-30 MED ORDER — LIDOCAINE 2% (20 MG/ML) 5 ML SYRINGE
INTRAMUSCULAR | Status: AC
  Filled 2016-07-30: qty 5

## 2016-07-30 MED ORDER — ONDANSETRON HCL 4 MG/2ML IJ SOLN: 4.0000 mg | Freq: Once | INTRAMUSCULAR | Status: DC | PRN

## 2016-07-30 MED ORDER — BUPIVACAINE HCL (PF) 0.5 % IJ SOLN
INTRAMUSCULAR | Status: AC
  Filled 2016-07-30: qty 30

## 2016-07-30 MED ORDER — PROPOFOL 10 MG/ML IV BOLUS
INTRAVENOUS | Status: DC | PRN
  Administered 2016-07-30: 150 mg via INTRAVENOUS

## 2016-07-30 MED ORDER — DEXAMETHASONE SODIUM PHOSPHATE 10 MG/ML IJ SOLN
INTRAMUSCULAR | Status: AC
  Filled 2016-07-30: qty 1

## 2016-07-30 MED ORDER — ONDANSETRON HCL 4 MG/2ML IJ SOLN
INTRAMUSCULAR | Status: DC | PRN
  Administered 2016-07-30: 4 mg via INTRAVENOUS

## 2016-07-30 MED ORDER — MIDAZOLAM HCL 2 MG/2ML IJ SOLN
INTRAMUSCULAR | Status: AC
  Filled 2016-07-30: qty 2

## 2016-07-30 MED ORDER — FENTANYL CITRATE (PF) 100 MCG/2ML IJ SOLN: 25.0000 ug | INTRAMUSCULAR | Status: DC | PRN

## 2016-07-30 MED ORDER — SUCCINYLCHOLINE CHLORIDE 20 MG/ML IJ SOLN
INTRAMUSCULAR | Status: DC | PRN
  Administered 2016-07-30: 140 mg via INTRAVENOUS

## 2016-07-30 MED ORDER — BUPIVACAINE HCL (PF) 0.5 % IJ SOLN
INTRAMUSCULAR | Status: DC | PRN
  Administered 2016-07-30: 20 mL

## 2016-07-30 MED ORDER — METRONIDAZOLE IN NACL 5-0.79 MG/ML-% IV SOLN
500.0000 mg | Freq: Three times a day (TID) | INTRAVENOUS | Status: DC
  Administered 2016-07-30 – 2016-08-01 (×7): 500 mg via INTRAVENOUS
  Filled 2016-07-30 (×8): qty 100

## 2016-07-30 MED ORDER — ONDANSETRON HCL 4 MG/2ML IJ SOLN
INTRAMUSCULAR | Status: AC
  Filled 2016-07-30: qty 2

## 2016-07-30 MED ORDER — KETOROLAC TROMETHAMINE 30 MG/ML IJ SOLN
INTRAMUSCULAR | Status: AC
  Filled 2016-07-30: qty 1

## 2016-07-30 MED ORDER — ROCURONIUM BROMIDE 50 MG/5ML IV SOSY
PREFILLED_SYRINGE | INTRAVENOUS | Status: AC
Start: 2016-07-30 — End: ?
  Filled 2016-07-30: qty 5

## 2016-07-30 MED ORDER — MORPHINE SULFATE (PF) 2 MG/ML IV SOLN
2.0000 mg | INTRAVENOUS | Status: DC | PRN
  Administered 2016-07-30 – 2016-07-31 (×5): 2 mg via INTRAVENOUS
  Filled 2016-07-30 (×5): qty 1

## 2016-07-30 MED ORDER — HYDROMORPHONE HCL-NACL 0.5-0.9 MG/ML-% IV SOSY
1.0000 mg | PREFILLED_SYRINGE | INTRAVENOUS | Status: DC | PRN
  Administered 2016-07-30 (×2): 1 mg via INTRAVENOUS
  Filled 2016-07-30 (×2): qty 2

## 2016-07-30 MED ORDER — DEXTROSE 5 % IV SOLN
2.0000 g | INTRAVENOUS | Status: DC
  Administered 2016-07-30 – 2016-08-01 (×3): 2 g via INTRAVENOUS
  Filled 2016-07-30 (×3): qty 2

## 2016-07-30 MED ORDER — PROPOFOL 10 MG/ML IV BOLUS
INTRAVENOUS | Status: AC
  Filled 2016-07-30: qty 20

## 2016-07-30 MED ORDER — LACTATED RINGERS IV SOLN
INTRAVENOUS | Status: DC
  Administered 2016-07-30 (×2): via INTRAVENOUS

## 2016-07-30 MED ORDER — SUGAMMADEX SODIUM 200 MG/2ML IV SOLN
INTRAVENOUS | Status: AC
  Filled 2016-07-30: qty 2

## 2016-07-30 MED ORDER — ONDANSETRON 4 MG PO TBDP: 4.0000 mg | ORAL_TABLET | Freq: Four times a day (QID) | ORAL | Status: DC | PRN

## 2016-07-30 SURGICAL SUPPLY — 27 items
APPLIER CLIP 5 13 M/L LIGAMAX5 (MISCELLANEOUS)
CABLE HIGH FREQUENCY MONO STRZ (ELECTRODE) ×3 IMPLANT
CHLORAPREP W/TINT 26ML (MISCELLANEOUS) ×3 IMPLANT
CLIP APPLIE 5 13 M/L LIGAMAX5 (MISCELLANEOUS) IMPLANT
COVER SURGICAL LIGHT HANDLE (MISCELLANEOUS) ×3 IMPLANT
CUTTER FLEX LINEAR 45M (STAPLE) ×3 IMPLANT
DECANTER SPIKE VIAL GLASS SM (MISCELLANEOUS) ×3 IMPLANT
DRAPE LAPAROSCOPIC ABDOMINAL (DRAPES) ×3 IMPLANT
ELECT REM PT RETURN 15FT ADLT (MISCELLANEOUS) ×3 IMPLANT
GLOVE SURG SIGNA 7.5 PF LTX (GLOVE) ×6 IMPLANT
GOWN STRL REUS W/TWL XL LVL3 (GOWN DISPOSABLE) ×6 IMPLANT
IRRIG SUCT STRYKERFLOW 2 WTIP (MISCELLANEOUS) ×3
IRRIGATION SUCT STRKRFLW 2 WTP (MISCELLANEOUS) ×1 IMPLANT
KIT BASIN OR (CUSTOM PROCEDURE TRAY) ×3 IMPLANT
POUCH SPECIMEN RETRIEVAL 10MM (ENDOMECHANICALS) ×3 IMPLANT
RELOAD 45 VASCULAR/THIN (ENDOMECHANICALS) IMPLANT
RELOAD STAPLE TA45 3.5 REG BLU (ENDOMECHANICALS) ×3 IMPLANT
SHEARS HARMONIC ACE PLUS 36CM (ENDOMECHANICALS) ×3 IMPLANT
SLEEVE XCEL OPT CAN 5 100 (ENDOMECHANICALS) ×3 IMPLANT
SUT MNCRL AB 4-0 PS2 18 (SUTURE) ×3 IMPLANT
TOWEL OR 17X26 10 PK STRL BLUE (TOWEL DISPOSABLE) ×3 IMPLANT
TOWEL OR NON WOVEN STRL DISP B (DISPOSABLE) ×3 IMPLANT
TRAY FOLEY W/METER SILVER 16FR (SET/KITS/TRAYS/PACK) IMPLANT
TRAY LAPAROSCOPIC (CUSTOM PROCEDURE TRAY) ×3 IMPLANT
TROCAR BLADELESS OPT 5 100 (ENDOMECHANICALS) ×3 IMPLANT
TROCAR XCEL BLUNT TIP 100MML (ENDOMECHANICALS) ×3 IMPLANT
TUBING INSUF HEATED (TUBING) IMPLANT

## 2016-07-30 NOTE — Progress Notes (Signed)
Patient ID: Valerie AdlerStephanie R Mitchell, female   DOB: Mar 05, 1975, 41 y.o.   MRN: [id number]  [facility]  Day of Surgery  Subjective: CC- appendicitis Patient states that she continues to have lower abdominal pain, pain medication does help. She had 1 episode of n/v last night, nausea now well controlled with zofran. She is NPO and on rocephin/flagyl.  Objective: Vital signs in last 24 hours: Temp:  [98.2 F (36.8 C)-100.2 F (37.9 C)] 98.2 F (36.8 C) (07/25 0616) Pulse Rate:  [83-101] 83 (07/25 0616) Resp:  [18-20] 18 (07/25 0616) BP: (101-133)/(55-92) 101/56 (07/25 0616) SpO2:  [96 %-100 %] 98 % (07/25 0616) Weight:  [145 lb (65.8 kg)] 145 lb (65.8 kg) (07/24 2324) Last BM Date: 07/27/16  Intake/Output from previous day: 07/24 0701 - 07/25 0700 In: 3025 [I.V.:2875; IV Piggyback:150] Out: -  Intake/Output this shift: No intake/output data recorded.  PE: Gen:  Alert, NAD, pleasant HEENT: EOM's intact, pupils equal  Card:  RRR, no M/G/R heard Pulm:  CTAB, no W/R/R, effort normal Abd: Soft, nondistended, +BS, no HSM, no hernia, +TTP RLQ>LLQ Ext:  No erythema, edema, or tenderness BUE/BLE  Psych: A&Ox3  Skin: no rashes noted, warm and dry  Lab Results:   Recent Labs  07/29/16 1930  WBC 12.3*  HGB 12.0  HCT 35.8*  PLT 294   BMET  Recent Labs  07/29/16 1930  NA 137  K 3.7  CL 103  CO2 19*  GLUCOSE 81  BUN 10  CREATININE 0.99  CALCIUM 8.5*   PT/INR No results for input(s): LABPROT, INR in the last 72 hours. CMP     Component Value Date/Time   NA 137 07/29/2016 1930   K 3.7 07/29/2016 1930   CL 103 07/29/2016 1930   CO2 19 (L) 07/29/2016 1930   GLUCOSE 81 07/29/2016 1930   BUN 10 07/29/2016 1930   CREATININE 0.99 07/29/2016 1930   CREATININE 1.05 03/27/2010 1650   CALCIUM 8.5 (L) 07/29/2016 1930   PROT 7.2 07/29/2016 1930   ALBUMIN 3.9 07/29/2016 1930   ALBUMIN 4.3 03/20/2010   AST 13 (L) 07/29/2016 1930   AST 12  03/20/2010   ALT 8 (L) 07/29/2016 1930   ALKPHOS 84 07/29/2016 1930   ALKPHOS 95 03/20/2010   BILITOT 0.7 07/29/2016 1930   BILITOT 0.3 03/20/2010   GFRNONAA >60 07/29/2016 1930   GFRAA >60 07/29/2016 1930   Lipase     Component Value Date/Time   LIPASE 16 07/29/2016 1930       Studies/Results: Ct Abdomen Pelvis W Contrast  Result Date: 07/29/2016 CLINICAL DATA:  Lower abdomen and pelvic pain for 3 days with nausea EXAM: CT ABDOMEN AND PELVIS WITH CONTRAST TECHNIQUE: Multidetector CT imaging of the abdomen and pelvis was performed using the standard protocol following bolus administration of intravenous contrast. CONTRAST:  100mL ISOVUE-300 IOPAMIDOL (ISOVUE-300) INJECTION 61% COMPARISON:  01/29/2010, CT 06/01/2009 FINDINGS: Lower chest: Lung bases demonstrate no acute consolidation or pleural effusion. Normal heart size. Hepatobiliary: No focal hepatic abnormality. Surgical clips in the gallbladder fossa. No biliary dilatation Pancreas: Unremarkable. No pancreatic ductal dilatation or surrounding inflammatory changes. Spleen: Normal in size without focal abnormality. Adrenals/Urinary Tract: Adrenal glands are within normal limits. Subcentimeter hypodensities in the kidneys too small to further characterize. Multiple punctate stones in the right kidney. Multiple small stones in the left kidney, largest is seen in the upper pole and measures 3 mm. Bladder negative. Stomach/Bowel: Stomach within normal limits. No dilated small bowel.  Significant right lower quadrant inflammation. Markedly dilated appendix, measuring up to 2 cm with hyperdensity in the proximal lumen measuring 2 cm, possibly a faintly calcified stone. No extraluminal gas collection. Vascular/Lymphatic: Non aneurysmal aorta. No significantly enlarged lymph nodes. Reproductive: Uterus and bilateral adnexa are unremarkable. 15 mm cyst left adnexa Other: No free air.  Small amount of free fluid in the pelvis Musculoskeletal: No acute  or significant osseous findings. IMPRESSION: 1. Markedly enlarged appendix up to 2 cm with oval intraluminal density measuring up to 2 cm suspicious for an appendicolith. Significant surrounding inflammatory changes in the right lower quadrant and the findings are consistent with an acute appendicitis. No extraluminal gas collections at this time. No evidence for a pelvic abscess. 2. Multiple nonobstructing stones within the bilateral kidneys. Electronically Signed   By: Jasmine PangKim  Fujinaga M.D.   On: 07/29/2016 20:46    Anti-infectives: Anti-infectives    Start     Dose/Rate Route Frequency Ordered Stop   07/30/16 0200  metroNIDAZOLE (FLAGYL) IVPB 500 mg     500 mg 100 mL/hr over 60 Minutes Intravenous Every 8 hours 07/30/16 0003     07/30/16 0100  cefTRIAXone (ROCEPHIN) 2 g in dextrose 5 % 50 mL IVPB     2 g 100 mL/hr over 30 Minutes Intravenous Every 24 hours 07/30/16 0003     07/29/16 2115  piperacillin-tazobactam (ZOSYN) IVPB 3.375 g     3.375 g 100 mL/hr over 30 Minutes Intravenous  Once 07/29/16 2107 07/29/16 2201       Assessment/Plan Acute appendicitis - abdominal surgical history: lap chole - 3-4 days of lower abdominal pain, followed by n/v - CT scan showed markedly enlarged appendix up to 2 cm with oval intraluminal density measuring up to 2 cm suspicious for an appendicolith. Significant surrounding inflammatory changes in the right lower quadrant and the findings are consistent with an acute appendicitis  ID - rocephin/flagyl 7/25>> FEN - IVF, NPO VTE - SCDs, hold heparin for procedure  Plan - planning for laparoscopic appendectomy today. Continue NPO and IV antibiotics.   LOS: 1 day    Edson SnowballBROOKE A MILLER , [facility] 07/30/2016, 8:44 AM Pager: [phone] Consults: [phone] Mon-Fri 7:00 am-4:30 pm Sat-Sun 7:00 am-11:30 am

## 2016-07-30 NOTE — Transfer of Care (Signed)
Immediate Anesthesia Transfer of Care Note  Patient: Valerie AdlerStephanie R Mitchell  Procedure(s) Performed: Procedure(s): APPENDECTOMY LAPAROSCOPIC (N/A)  Patient Location: PACU  Anesthesia Type:General  Level of Consciousness: awake, alert  and oriented  Airway & Oxygen Therapy: Patient Spontanous Breathing and Patient connected to nasal cannula oxygen  Post-op Assessment: Report given to RN and Post -op Vital signs reviewed and stable  Post vital signs: Reviewed and stable  Last Vitals:  Vitals:   07/30/16 0616 07/30/16 1338  BP: (!) 101/56 118/68  Pulse: 83 90  Resp: 18 (!) 8  Temp: 36.8 C 36.8 C    Last Pain:  Vitals:   07/30/16 0800  TempSrc:   PainSc: 0-No pain      Patients Stated Pain Goal: 4 (07/29/16 2324)  Complications: No apparent anesthesia complications

## 2016-07-30 NOTE — Care Management Note (Signed)
Case Management Note  Patient Details  Name: Valerie Mitchell MRN: [id number] Date of Birth: [birth year]  Subjective/Objective:      Appendicitis and abd.apain              Action/Plan: Date:  July 30 2016  Chart reviewed for concurrent status and case management needs.  Will continue to follow patient progress.  Discharge Planning: following for needs  Expected discharge date: [id number]  Marcelle SmilingRhonda Lulla [place], BSN, [place]RN3, [place]M   [phone]   Expected Discharge Date:                  Expected Discharge Plan:  Home/Self Care  In-House Referral:     Discharge planning Services  CM Consult  Post Acute Care Choice:    Choice offered to:     DME Arranged:    DME Agency:     HH Arranged:    HH Agency:     Status of Service:  In process, will continue to follow  If discussed at Long Length of Stay Meetings, dates discussed:    Additional Comments:  Golda AcreDavis, Lavaris Sexson Lynn, RN 07/30/2016, 9:19 AM

## 2016-07-30 NOTE — Anesthesia Postprocedure Evaluation (Signed)
Anesthesia Post Note  Patient: Valerie AdlerStephanie R Kosinski  Procedure(s) Performed: Procedure(s) (LRB): APPENDECTOMY LAPAROSCOPIC (N/A)     Patient location during evaluation: PACU Anesthesia Type: General Level of consciousness: awake and alert Pain management: pain level controlled Vital Signs Assessment: post-procedure vital signs reviewed and stable Respiratory status: spontaneous breathing, nonlabored ventilation, respiratory function stable and patient connected to nasal cannula oxygen Cardiovascular status: blood pressure returned to baseline and stable Postop Assessment: no signs of nausea or vomiting Anesthetic complications: no    Last Vitals:  Vitals:   07/30/16 1345 07/30/16 1400  BP: 116/67 119/69  Pulse: 81 84  Resp: 11 15  Temp:      Last Pain:  Vitals:   07/30/16 1400  TempSrc:   PainSc: 8                  Nasia Cannan

## 2016-07-30 NOTE — Op Note (Signed)
Appendectomy, Lap, Procedure Note  Indications: The patient presented with Mitchell history of right-sided abdominal pain. Mitchell CT revealed findings consistent with acute appendicitis.  Pre-operative Diagnosis: acute appendicitis  Post-operative Diagnosis: perforated appendicitis  Surgeon: Abigail MiyamotoBLACKMAN,Valerie Mitchell   Assistants: 0  Anesthesia: General endotracheal anesthesia  ASA Class: 2  Procedure Details  The patient was seen again in the Holding Room. The risks, benefits, complications, treatment options, and expected outcomes were discussed with the patient and/or family. The possibilities of reaction to medication, perforation of viscus, bleeding, recurrent infection, finding Mitchell normal appendix, the need for additional procedures, failure to diagnose Mitchell condition, and creating Mitchell complication requiring transfusion or operation were discussed. There was concurrence with the proposed plan and informed consent was obtained. The site of surgery was properly noted. The patient was taken to Operating Room, identified as Valerie AdlerStephanie R Mitchell and the procedure verified as Appendectomy. Mitchell Time Out was held and the above information confirmed.  The patient was placed in the supine position and general anesthesia was induced, along with placement of orogastric tube, Venodyne boots, and Mitchell Foley catheter. The abdomen was prepped and draped in Mitchell sterile fashion. Mitchell one centimeter infraumbilical incision was made.  The umbilical stalk was elevated, and the midline fascia was incised with Mitchell scaple.  Mitchell Kelly clamp was used to confirm entrance into the peritoneal cavity.  Mitchell pursestring suture was passed around the incision with Mitchell 0 Vicryl.  The Hasson was introduced into the abdomen and the tails of the suture were used to hold the Hasson in place.   The pneumoperitoneum was then established to steady pressure of 15 mmHg.  Additional 5 mm cannulas then placed in the left lower quadrant of the abdomen and the right upper  quadrant region under direct visualization. Mitchell careful evaluation of the entire abdomen was carried out. The patient was placed in Trendelenburg and left lateral decubitus position. The small intestines were retracted in the cephalad and left lateral direction away from the pelvis and right lower quadrant. The patient was found to have an enlarged and inflamed appendix that was extending into the pelvis. There was  evidence of focal perforation with an appendocolith beside the appendix.  The appendix was carefully dissected. The appendix was was skeletonized with the harmonic scalpel.   The appendix was divided at its base using an endo-GIA stapler. Minimal appendiceal stump was left in place. There was no evidence of bleeding, leakage, or complication after division of the appendix. Irrigation was also performed and irrigate suctioned from the abdomen as well.  The appendix and the appendocolith were place in an enosac and removed through the umbilicus  The umbilical port site was closed with the purse string suture. There was no residual palpable fascial defect.  The trocar site skin wounds were closed with 4-0 Monocryl.  Instrument, sponge, and needle counts were correct at the conclusion of the case.   Findings: The appendix was found to be inflamed. There were signs of necrosis.  There was perforation. There was not abscess formation.  Estimated Blood Loss:  Minimal         Drains:none         Complications:  None; patient tolerated the procedure well.         Disposition: PACU - hemodynamically stable.         Condition: stable

## 2016-07-30 NOTE — Discharge Instructions (Addendum)
Please arrive at least 30 min before your appointment to complete your check in paperwork.  If you are unable to arrive 30 min prior to your appointment time we may have to cancel or reschedule you. ° °LAPAROSCOPIC SURGERY: POST OP INSTRUCTIONS  °1. DIET: Follow a light bland diet the first 24 hours after arrival home, such as soup, liquids, crackers, etc. Be sure to include lots of fluids daily. Avoid fast food or heavy meals as your are more likely to get nauseated. Eat a low fat the next few days after surgery.  °2. Take your usually prescribed home medications unless otherwise directed. °3. PAIN CONTROL:  °1. Pain is best controlled by a usual combination of three different methods TOGETHER:  °1. Ice/Heat °2. Over the counter pain medication °3. Prescription pain medication °2. Most patients will experience some swelling and bruising around the incisions. Ice packs or heating pads (30-60 minutes up to 6 times a day) will help. Use ice for the first few days to help decrease swelling and bruising, then switch to heat to help relax tight/sore spots and speed recovery. Some people prefer to use ice alone, heat alone, alternating between ice & heat. Experiment to what works for you. Swelling and bruising can take several weeks to resolve.  °3. It is helpful to take an over-the-counter pain medication regularly for the first few weeks. Choose one of the following that works best for you:  °1. Naproxen (Aleve, etc) Two 220mg tabs twice a day °2. Ibuprofen (Advil, etc) Three 200mg tabs four times a day (every meal & bedtime) °3. Acetaminophen (Tylenol, etc) 500-650mg four times a day (every meal & bedtime) °4. A prescription for pain medication (such as oxycodone, hydrocodone, etc) should be given to you upon discharge. Take your pain medication as prescribed.  °1. If you are having problems/concerns with the prescription medicine (does not control pain, nausea, vomiting, rash, itching, etc), please call us [phone] to see if we need to switch you to a different pain medicine that will work better for you and/or control your side effect better. °2. If you need a refill on your pain medication, please contact your pharmacy. They will contact our office to request authorization. Prescriptions will not be filled after 5 pm or on week-ends. °4. Avoid getting constipated. Between the surgery and the pain medications, it is common to experience some constipation. Increasing fluid intake and taking a fiber supplement (such as Metamucil, Citrucel, FiberCon, MiraLax, etc) 1-2 times a day regularly will usually help prevent this problem from occurring. A mild laxative (prune juice, Milk of Magnesia, MiraLax, etc) should be taken according to package directions if there are no bowel movements after 48 hours.  °5. Watch out for diarrhea. If you have many loose bowel movements, simplify your diet to bland foods & liquids for a few days. Stop any stool softeners and decrease your fiber supplement. Switching to mild anti-diarrheal medications (Kayopectate, Pepto Bismol) can help. If this worsens or does not improve, please call us. °6. Wash / shower every day. You may shower over the dressings as they are waterproof. Continue to shower over incision(s) after the dressing is off. If there is glue over the incisions try not to pick it off, let it fall off naturally. °7. Remove your waterproof bandages 2 days after surgery. You may leave the incision open to air. You may replace a dressing/Band-Aid to cover the incision for comfort if you wish.  °8. ACTIVITIES as tolerated:  °  1. You may resume regular (light) daily activities beginning the next day--such as daily self-care, walking, climbing stairs--gradually increasing activities as tolerated. If you can walk 30 minutes without difficulty, it is safe to try more intense activity such as jogging, treadmill, bicycling, low-impact aerobics, swimming, etc. 2. Save the most intensive and  strenuous activity for last such as sit-ups, heavy lifting, contact sports, etc Refrain from any heavy lifting or straining until you are off narcotics for pain control. For the first 2-3 weeks do not lift over 10-15lb.  3. DO NOT PUSH THROUGH PAIN. Let pain be your guide: If it hurts to do something, don't do it. Pain is your body warning you to avoid that activity for another week until the pain goes down. 4. You may drive when you are no longer taking prescription pain medication, you can comfortably wear a seatbelt, and you can safely maneuver your car and apply brakes. 5. You may have sexual intercourse when it is comfortable.  9. FOLLOW UP in our office  1. Please call [facility] at [phone] to set up an appointment to see your surgeon in the office for a follow-up appointment approximately 2-3 weeks after your surgery. 2. Make sure that you call for this appointment the day you arrive home to insure a convenient appointment time.      10. IF YOU HAVE DISABILITY OR FAMILY LEAVE FORMS, BRING THEM TO THE               OFFICE FOR PROCESSING.   WHEN TO CALL US [phone]:  1. Poor pain control 2. Reactions / problems with new medications (rash/itching, nausea, etc)  3. Fever over 101.5 F (38.5 C) 4. Inability to urinate 5. Nausea and/or vomiting 6. Worsening swelling or bruising 7. Continued bleeding from incision. 8. Increased pain, redness, or drainage from the incision  The clinic staff is available to answer your questions during regular business hours (8:30am-5pm). Please dont hesitate to call and ask to speak to one of our nurses for clinical concerns.  If you have a medical emergency, go to the nearest emergency room or call 911.  A surgeon from [facility] is always on call at the [facility]   [facility], [place]  [address], [address], [place], [place] [address] ?  MAIN: [phone] ? TOLL FREE: [phone] ?  FAX [phone]    [email or web address]  [facility] - DISCHARGE INSTRUCTIONS TO PATIENT  Return to work on:  08/18/2016  Activity:  Driving - May drive in a day or two when you go home, if feeling well   Lifting - No heavy lifting (>15 pounds) for one week, then no limit  Wound Care:   May shower  Diet:  As tolerated  Follow up appointment:  Call Dr. Eliberto IvoryBlackman's office [facility]) at [phone] for an appointment in 2 to 3 weeks.  Medications and dosages:  Resume your home medications.  You have a prescription for:  Augmentin  Call Dr. Magnus IvanBlackman or his office  [phone]) if you have:  Temperature greater than 100.4,  Persistent nausea and vomiting,  Severe uncontrolled pain,  Redness, tenderness, or signs of infection (pain, swelling, redness, odor or green/yellow discharge around the site),  Difficulty breathing, headache or visual disturbances,  Any other questions or concerns you may have after discharge.  In an emergency, call 911 or go to an Emergency Department at a nearby hospital.

## 2016-07-30 NOTE — Anesthesia Procedure Notes (Signed)
Procedure Name: Intubation Date/Time: 07/30/2016 12:34 PM Performed by: [place] Niccole C Pre-anesthesia Checklist: Patient identified, Emergency Drugs available, Suction available and Patient being monitored Patient Re-evaluated:Patient Re-evaluated prior to induction Oxygen Delivery Method: Circle system utilized Preoxygenation: Pre-oxygenation with 100% oxygen Induction Type: IV induction Ventilation: Mask ventilation without difficulty Laryngoscope Size: Mac and 3 Grade View: Grade I Tube type: Oral Tube size: 7.0 mm Number of attempts: 1 Airway Equipment and Method: Stylet and Oral airway Placement Confirmation: ETT inserted through vocal cords under direct vision,  positive ETCO2 and breath sounds checked- equal and bilateral Tube secured with: Tape Dental Injury: Teeth and Oropharynx as per pre-operative assessment

## 2016-07-30 NOTE — Anesthesia Preprocedure Evaluation (Addendum)
Anesthesia Evaluation    Airway Mallampati: II  TM Distance: >3 FB Neck ROM: Full    Dental no notable dental hx.    Pulmonary former smoker,    Pulmonary exam normal breath sounds clear to auscultation       Cardiovascular Normal cardiovascular exam Rhythm:Regular Rate:Normal     Neuro/Psych Depression    GI/Hepatic   Endo/Other    Renal/GU Renal InsufficiencyRenal disease     Musculoskeletal   Abdominal   Peds  Hematology   Anesthesia Other Findings Depression  Pancreatitis possibly ETOH related 2010 Pyonephritis  Renal insufficiency from 2010   Reproductive/Obstetrics                            Anesthesia Physical Anesthesia Plan  ASA: II  Anesthesia Plan: General   Post-op Pain Management:    Induction: Intravenous  PONV Risk Score and Plan: 2 and Ondansetron, Dexamethasone, Treatment may vary due to age or medical condition and Propofol  Airway Management Planned: Oral ETT  Additional Equipment:   Intra-op Plan:   Post-operative Plan: Extubation in OR  Informed Consent:   Plan Discussed with:   Anesthesia Plan Comments: (  )        Anesthesia Quick Evaluation

## 2016-07-31 ENCOUNTER — Encounter (HOSPITAL_COMMUNITY): Payer: Self-pay | Admitting: Surgery

## 2016-07-31 LAB — CBC
HEMATOCRIT: 32.8 % — AB (ref 36.0–46.0)
HEMOGLOBIN: 10.8 g/dL — AB (ref 12.0–15.0)
MCH: 31.1 pg (ref 26.0–34.0)
MCHC: 32.9 g/dL (ref 30.0–36.0)
MCV: 94.5 fL (ref 78.0–100.0)
Platelets: 285 10*3/uL (ref 150–400)
RBC: 3.47 MIL/uL — ABNORMAL LOW (ref 3.87–5.11)
RDW: 14.5 % (ref 11.5–15.5)
WBC: 10.7 10*3/uL — AB (ref 4.0–10.5)

## 2016-07-31 LAB — URINE CULTURE

## 2016-07-31 MED ORDER — CHLORHEXIDINE GLUCONATE CLOTH 2 % EX PADS
6.0000 | MEDICATED_PAD | Freq: Every day | CUTANEOUS | Status: AC
  Administered 2016-07-31 – 2016-08-04 (×2): 6 via TOPICAL

## 2016-07-31 MED ORDER — NAPROXEN 250 MG PO TABS
500.0000 mg | ORAL_TABLET | Freq: Two times a day (BID) | ORAL | Status: DC
  Administered 2016-07-31 – 2016-08-02 (×3): 500 mg via ORAL
  Filled 2016-07-31 (×7): qty 2

## 2016-07-31 MED ORDER — ONDANSETRON HCL 4 MG PO TABS
4.0000 mg | ORAL_TABLET | Freq: Four times a day (QID) | ORAL | Status: DC | PRN
  Administered 2016-07-31 – 2016-08-02 (×2): 4 mg via ORAL
  Filled 2016-07-31 (×2): qty 1

## 2016-07-31 MED ORDER — AMOXICILLIN-POT CLAVULANATE 875-125 MG PO TABS: 1.0000 | ORAL_TABLET | Freq: Two times a day (BID) | ORAL | 0 refills | Status: DC

## 2016-07-31 MED ORDER — ACETAMINOPHEN 325 MG PO TABS
650.0000 mg | ORAL_TABLET | Freq: Four times a day (QID) | ORAL | Status: DC
  Administered 2016-07-31 – 2016-08-03 (×7): 650 mg via ORAL
  Filled 2016-07-31 (×7): qty 2

## 2016-07-31 MED ORDER — ONDANSETRON HCL 4 MG/2ML IJ SOLN
4.0000 mg | Freq: Four times a day (QID) | INTRAMUSCULAR | Status: DC | PRN
  Administered 2016-07-31 – 2016-08-04 (×4): 4 mg via INTRAVENOUS
  Filled 2016-07-31 (×5): qty 2

## 2016-07-31 MED ORDER — ACETAMINOPHEN 325 MG PO TABS: 650.0000 mg | ORAL_TABLET | Freq: Four times a day (QID) | ORAL | Status: DC | PRN

## 2016-07-31 MED ORDER — MUPIROCIN 2 % EX OINT
1.0000 "application " | TOPICAL_OINTMENT | Freq: Two times a day (BID) | CUTANEOUS | Status: AC
  Administered 2016-07-31 – 2016-08-04 (×7): 1 via NASAL
  Filled 2016-07-31 (×2): qty 22

## 2016-07-31 MED ORDER — HYDROMORPHONE HCL-NACL 0.5-0.9 MG/ML-% IV SOSY
0.5000 mg | PREFILLED_SYRINGE | INTRAVENOUS | Status: DC | PRN
  Administered 2016-07-31 (×2): 1 mg via INTRAVENOUS
  Filled 2016-07-31: qty 4

## 2016-07-31 MED ORDER — HYDROMORPHONE HCL-NACL 0.5-0.9 MG/ML-% IV SOSY
0.5000 mg | PREFILLED_SYRINGE | INTRAVENOUS | Status: DC | PRN
  Administered 2016-07-31 – 2016-08-01 (×2): 1 mg via INTRAVENOUS
  Filled 2016-07-31 (×2): qty 2

## 2016-07-31 MED ORDER — METHOCARBAMOL 500 MG PO TABS: 500.0000 mg | ORAL_TABLET | Freq: Four times a day (QID) | ORAL | Status: DC | PRN

## 2016-07-31 MED ORDER — OXYCODONE HCL 5 MG PO TABS: 5.0000 mg | ORAL_TABLET | ORAL | 0 refills | Status: DC | PRN

## 2016-07-31 MED ORDER — METHOCARBAMOL 500 MG PO TABS
500.0000 mg | ORAL_TABLET | Freq: Three times a day (TID) | ORAL | Status: DC
  Administered 2016-07-31 – 2016-08-02 (×7): 500 mg via ORAL
  Filled 2016-07-31 (×8): qty 1

## 2016-07-31 NOTE — Progress Notes (Signed)
Patient ID: Valerie AdlerStephanie R Mitchell, female   DOB: 1975-12-13, 41 y.o.   MRN: [id number]  [facility]  1 Day Post-Op  Subjective: CC- appendicitis Per RN patient had a rough evening. Pain got out of control, now doing better now after dilaudid. She ambulated yesterday but has not been OOB today. Tolerated clear liquids yesterday but has not taken anything PO this morning.   Objective: Vital signs in last 24 hours: Temp:  [97.5 F (36.4 C)-99 F (37.2 C)] 98 F (36.7 C) (07/26 0512) Pulse Rate:  [80-102] 80 (07/26 0512) Resp:  [8-22] 16 (07/26 0512) BP: (95-124)/(47-76) 95/58 (07/26 0512) SpO2:  [96 %-100 %] 96 % (07/26 0512) Last BM Date: 07/28/16  Intake/Output from previous day: 07/25 0701 - 07/26 0700 In: 1240 [P.O.:240; I.V.:1000] Out: 25 [Blood:25] Intake/Output this shift: No intake/output data recorded.  PE: Gen:  Alert, NAD, pleasant HEENT: EOM's intact, pupils equal and round Card:  RRR, no M/G/R heard Pulm:  CTAB, no W/R/R, effort normal Abd: Soft, ND, globally tender, no guarding/rebound, +BS, multiple lap incisions C/D/I Ext:  No erythema, edema, or tenderness BUE/BLE  Psych: A&Ox3  Skin: no rashes noted, warm and dry  Lab Results:   Recent Labs  07/29/16 1930 07/31/16 0648  WBC 12.3* 10.7*  HGB 12.0 10.8*  HCT 35.8* 32.8*  PLT 294 285   BMET  Recent Labs  07/29/16 1930  NA 137  K 3.7  CL 103  CO2 19*  GLUCOSE 81  BUN 10  CREATININE 0.99  CALCIUM 8.5*   PT/INR No results for input(s): LABPROT, INR in the last 72 hours. CMP     Component Value Date/Time   NA 137 07/29/2016 1930   K 3.7 07/29/2016 1930   CL 103 07/29/2016 1930   CO2 19 (L) 07/29/2016 1930   GLUCOSE 81 07/29/2016 1930   BUN 10 07/29/2016 1930   CREATININE 0.99 07/29/2016 1930   CREATININE 1.05 03/27/2010 1650   CALCIUM 8.5 (L) 07/29/2016 1930   PROT 7.2 07/29/2016 1930   ALBUMIN 3.9 07/29/2016 1930   ALBUMIN 4.3 03/20/2010   AST 13 (L)  07/29/2016 1930   AST 12 03/20/2010   ALT 8 (L) 07/29/2016 1930   ALKPHOS 84 07/29/2016 1930   ALKPHOS 95 03/20/2010   BILITOT 0.7 07/29/2016 1930   BILITOT 0.3 03/20/2010   GFRNONAA >60 07/29/2016 1930   GFRAA >60 07/29/2016 1930   Lipase     Component Value Date/Time   LIPASE 16 07/29/2016 1930       Studies/Results: Ct Abdomen Pelvis W Contrast  Result Date: 07/29/2016 CLINICAL DATA:  Lower abdomen and pelvic pain for 3 days with nausea EXAM: CT ABDOMEN AND PELVIS WITH CONTRAST TECHNIQUE: Multidetector CT imaging of the abdomen and pelvis was performed using the standard protocol following bolus administration of intravenous contrast. CONTRAST:  100mL ISOVUE-300 IOPAMIDOL (ISOVUE-300) INJECTION 61% COMPARISON:  01/29/2010, CT 06/01/2009 FINDINGS: Lower chest: Lung bases demonstrate no acute consolidation or pleural effusion. Normal heart size. Hepatobiliary: No focal hepatic abnormality. Surgical clips in the gallbladder fossa. No biliary dilatation Pancreas: Unremarkable. No pancreatic ductal dilatation or surrounding inflammatory changes. Spleen: Normal in size without focal abnormality. Adrenals/Urinary Tract: Adrenal glands are within normal limits. Subcentimeter hypodensities in the kidneys too small to further characterize. Multiple punctate stones in the right kidney. Multiple small stones in the left kidney, largest is seen in the upper pole and measures 3 mm. Bladder negative. Stomach/Bowel: Stomach within normal limits. No dilated  small bowel. Significant right lower quadrant inflammation. Markedly dilated appendix, measuring up to 2 cm with hyperdensity in the proximal lumen measuring 2 cm, possibly a faintly calcified stone. No extraluminal gas collection. Vascular/Lymphatic: Non aneurysmal aorta. No significantly enlarged lymph nodes. Reproductive: Uterus and bilateral adnexa are unremarkable. 15 mm cyst left adnexa Other: No free air.  Small amount of free fluid in the pelvis  Musculoskeletal: No acute or significant osseous findings. IMPRESSION: 1. Markedly enlarged appendix up to 2 cm with oval intraluminal density measuring up to 2 cm suspicious for an appendicolith. Significant surrounding inflammatory changes in the right lower quadrant and the findings are consistent with an acute appendicitis. No extraluminal gas collections at this time. No evidence for a pelvic abscess. 2. Multiple nonobstructing stones within the bilateral kidneys. Electronically Signed   By: Jasmine PangKim  Fujinaga M.D.   On: 07/29/2016 20:46    Anti-infectives: Anti-infectives    Start     Dose/Rate Route Frequency Ordered Stop   07/30/16 0200  metroNIDAZOLE (FLAGYL) IVPB 500 mg     500 mg 100 mL/hr over 60 Minutes Intravenous Every 8 hours 07/30/16 0003     07/30/16 0100  cefTRIAXone (ROCEPHIN) 2 g in dextrose 5 % 50 mL IVPB     2 g 100 mL/hr over 30 Minutes Intravenous Every 24 hours 07/30/16 0003     07/29/16 2115  piperacillin-tazobactam (ZOSYN) IVPB 3.375 g     3.375 g 100 mL/hr over 30 Minutes Intravenous  Once 07/29/16 2107 07/29/16 2201       Assessment/Plan perforated appendicitis S/p lap appy 7/25 Dr. Magnus IvanBlackman - POD 1 - WBC trending down 10.7, afebrile - tolerating clears  ID - rocephin/flagyl 7/25>> FEN - IVF, clears advance as tolerated VTE - SCDs  Plan - plan to recheck patient later today for possible discharge. Pain control needs to improve prior to discharge; schedule tylenol/naproxen/robaxin. Advance diet as tolerated. When ready for discharge she will need to go home with 10 days of augmentin.   LOS: 2 days    Franne FortsBROOKE A Ellery Tash , [facility] 07/31/2016, 8:45 AM Pager: [phone] Consults: [phone] Mon-Fri 7:00 am-4:30 pm Sat-Sun 7:00 am-11:30 am

## 2016-07-31 NOTE — Progress Notes (Addendum)
Pt had held off on asking for pain meds for too long because she was told she could not go home if she kept taking pain medication. By 9pm pt was in tears, holding her rt side, c/o it hurt to breath, cant take a deep breath, feels very bloated. Ice packs provided for surgical sites and deep breathing exercises encouraged once pain meds given. Pain has decreased minimally from 10 to 6 but still looks uncomfortable. Unable to lay on rt side due to increased pain, left side lying position feels slightly better. Decreased breath sounds in bases and hypoactive BS noted to all 4 quadrants. 5:34am text msg to on call for [facility] ( Dr Michaell CowingGross) as pt has had 2mg  Mso4 x 4doses & 2 doses 10mg  OxyIR. New orders obtained.

## 2016-08-01 MED ORDER — DIMENHYDRINATE 50 MG PO TABS
50.0000 mg | ORAL_TABLET | Freq: Once | ORAL | Status: AC
  Administered 2016-08-01: 50 mg via ORAL
  Filled 2016-08-01: qty 1

## 2016-08-01 MED ORDER — AMOXICILLIN-POT CLAVULANATE 875-125 MG PO TABS
1.0000 | ORAL_TABLET | Freq: Two times a day (BID) | ORAL | Status: DC
  Administered 2016-08-01 – 2016-08-03 (×5): 1 via ORAL
  Filled 2016-08-01 (×5): qty 1

## 2016-08-01 MED ORDER — METOCLOPRAMIDE HCL 5 MG/ML IJ SOLN
5.0000 mg | Freq: Four times a day (QID) | INTRAMUSCULAR | Status: DC | PRN
  Administered 2016-08-02: 10 mg via INTRAVENOUS
  Filled 2016-08-01: qty 2

## 2016-08-01 NOTE — Progress Notes (Signed)
Patient ID: Valerie AdlerStephanie R Gappa, female   DOB: [birth year], 41 y.o.   MRN: [id number]  [facility]  2 Days Post-Op  Subjective: CC- appendicitis Patient states that her abdomen is sore today, but pain is significantly improved from yesterday. She has not taken any IV pain medication today. States that she is tolerating small amounts of diet, it does not make her feel nauseated. States that she gets nauseated and vomits when she ambulates. She reports a history of gastroparesis; takes dramamine at home which helps.  Objective: Vital signs in last 24 hours: Temp:  [97.8 F (36.6 C)-98.1 F (36.7 C)] 98.1 F (36.7 C) (07/27 0545) Pulse Rate:  [72-88] 88 (07/27 0545) Resp:  [16] 16 (07/27 0545) BP: (106-112)/(62-80) 109/62 (07/27 0545) SpO2:  [92 %-95 %] 93 % (07/27 0545) Last BM Date: 07/28/16  Intake/Output from previous day: 07/26 0701 - 07/27 0700 In: 480 [P.O.:480] Out: -  Intake/Output this shift: No intake/output data recorded.  PE: Gen:  Alert, NAD, pleasant HEENT: EOM's intact, pupils equal and round Card:  RRR, no M/G/R heard Pulm:  CTAB, no W/R/R, effort normal Abd: Soft, ND, appropriately tender, +BS, multiple lap incisions C/D/I Ext:  No erythema, edema, or tenderness BUE/BLE  Psych: A&Ox3  Skin: no rashes noted, warm and dry   Lab Results:   Recent Labs  07/29/16 1930 07/31/16 0648  WBC 12.3* 10.7*  HGB 12.0 10.8*  HCT 35.8* 32.8*  PLT 294 285   BMET  Recent Labs  07/29/16 1930  NA 137  K 3.7  CL 103  CO2 19*  GLUCOSE 81  BUN 10  CREATININE 0.99  CALCIUM 8.5*   PT/INR No results for input(s): LABPROT, INR in the last 72 hours. CMP     Component Value Date/Time   NA 137 07/29/2016 1930   K 3.7 07/29/2016 1930   CL 103 07/29/2016 1930   CO2 19 (L) 07/29/2016 1930   GLUCOSE 81 07/29/2016 1930   BUN 10 07/29/2016 1930   CREATININE 0.99 07/29/2016 1930   CREATININE 1.05 03/27/2010 1650   CALCIUM 8.5 (L)  07/29/2016 1930   PROT 7.2 07/29/2016 1930   ALBUMIN 3.9 07/29/2016 1930   ALBUMIN 4.3 03/20/2010   AST 13 (L) 07/29/2016 1930   AST 12 03/20/2010   ALT 8 (L) 07/29/2016 1930   ALKPHOS 84 07/29/2016 1930   ALKPHOS 95 03/20/2010   BILITOT 0.7 07/29/2016 1930   BILITOT 0.3 03/20/2010   GFRNONAA >60 07/29/2016 1930   GFRAA >60 07/29/2016 1930   Lipase     Component Value Date/Time   LIPASE 16 07/29/2016 1930       Studies/Results: No results found.  Anti-infectives: Anti-infectives    Start     Dose/Rate Route Frequency Ordered Stop   07/31/16 0000  amoxicillin-clavulanate (AUGMENTIN) 875-125 MG tablet     1 tablet Oral 2 times daily 07/31/16 0851     07/30/16 0200  metroNIDAZOLE (FLAGYL) IVPB 500 mg     500 mg 100 mL/hr over 60 Minutes Intravenous Every 8 hours 07/30/16 0003     07/30/16 0100  cefTRIAXone (ROCEPHIN) 2 g in dextrose 5 % 50 mL IVPB     2 g 100 mL/hr over 30 Minutes Intravenous Every 24 hours 07/30/16 0003     07/29/16 2115  piperacillin-tazobactam (ZOSYN) IVPB 3.375 g     3.375 g 100 mL/hr over 30 Minutes Intravenous  Once 07/29/16 2107 07/29/16 2201  Assessment/Plan perforated appendicitis S/p lap appy 7/25 Dr. Magnus IvanBlackman - POD 2 - pain improved but nausea persists - tolerating small amount of diet  ID - rocephin/flagyl 7/25>>7/27, augmentin 7/27>> FEN - IVF, regular diet VTE - SCDs  Plan - give a dose of dramamine for nausea. Plan to recheck patient later today for possible discharge if nausea improves. When ready for discharge she will need to go home with 10 days of augmentin   LOS: 3 days    Franne FortsBROOKE A MEUTH , [facility] 08/01/2016, 9:59 AM Pager: [phone] Consults: [phone] Mon-Fri 7:00 am-4:30 pm Sat-Sun 7:00 am-11:30 am

## 2016-08-02 ENCOUNTER — Inpatient Hospital Stay (HOSPITAL_COMMUNITY): Payer: Self-pay

## 2016-08-02 LAB — BASIC METABOLIC PANEL
ANION GAP: 9 (ref 5–15)
BUN: 12 mg/dL (ref 6–20)
CHLORIDE: 110 mmol/L (ref 101–111)
CO2: 20 mmol/L — AB (ref 22–32)
Calcium: 8.1 mg/dL — ABNORMAL LOW (ref 8.9–10.3)
Creatinine, Ser: 0.67 mg/dL (ref 0.44–1.00)
GFR calc non Af Amer: >60 mL/min (ref >60)
Glucose, Bld: 102 mg/dL — ABNORMAL HIGH (ref 65–99)
POTASSIUM: 3.3 mmol/L — AB (ref 3.5–5.1)
Sodium: 139 mmol/L (ref 135–145)

## 2016-08-02 LAB — CBC
HEMATOCRIT: 33.6 % — AB (ref 36.0–46.0)
Hemoglobin: 11.2 g/dL — ABNORMAL LOW (ref 12.0–15.0)
MCH: 31.2 pg (ref 26.0–34.0)
MCHC: 33.3 g/dL (ref 30.0–36.0)
MCV: 93.6 fL (ref 78.0–100.0)
Platelets: 309 10*3/uL (ref 150–400)
RBC: 3.59 MIL/uL — ABNORMAL LOW (ref 3.87–5.11)
RDW: 14.9 % (ref 11.5–15.5)
WBC: 14.5 10*3/uL — ABNORMAL HIGH (ref 4.0–10.5)

## 2016-08-02 MED ORDER — KCL IN DEXTROSE-NACL 20-5-0.45 MEQ/L-%-% IV SOLN
INTRAVENOUS | Status: DC
  Administered 2016-08-02 – 2016-08-04 (×5): via INTRAVENOUS
  Filled 2016-08-02 (×8): qty 1000

## 2016-08-02 NOTE — Discharge Summary (Signed)
Physician Discharge Summary  Valerie AdlerStephanie R Mitchell ZOX:[id number] DOB: [birth year] DOA: 07/29/2016  PCP: Babs SciaraLuking, Valerie A, MD  Admit date: 07/29/2016 Discharge date: 08/02/2016  Recommendations for Outpatient Follow-up:   Follow-up Information    [facility].   Specialty:  General Surgery Why:  Your appointment is 08/14/16 at 1:30 pm. Please arrive at least 30 min before your appointment to complete your check in paperwork. Contact information: [address] [place] [id number] [phone]         Discharge Diagnoses:  1. Perforated appendicitis 2. Depression  Surgical Procedure: lap appendectomy - Dr Magnus IvanBlackman 725/2018  Discharge Condition: good Disposition: home  Diet recommendation: regular  Filed Weights   07/29/16 1721 07/29/16 2324  Weight: 65.8 kg (145 lb) 65.8 kg (145 lb)    History of present illness:  Patient is a generally healthy 41 year old female who 3-4 days ago developed the gradual onset of lower abdominal pain. She describes constant aching pain across her lower abdomen. She was starting her menstrual period at that time and attributed the pain to this. However as she finished her menstrual period the pain persisted and has gradually worsened. She describes increasing aching pain across her lower abdomen radiating up to her epigastrium. Constant and worse with motion. She has been nauseated without vomiting. She has noted fever and some chills the last 24-48 hours. She gets occasional sharp stabbing pain in the right lower quadrant with motion. Bowel movements normal. No history of previous similar symptoms or chronic GI complaints. She presented to [facility] [place] where CT scan was performed consistent with appendicitis as below.  Hospital Course:  Taken to OR by Dr Magnus IvanBlackman 7/25. Found to have focally contained perforation. Cont on abx postoperatively and diet gradually advanced. She did  have some issues with n/v but reports a h/o gastroparesis. On POD 3, she was tolerating a diet, had had a BM, ambulating well, vitals were stable and pain was controlled and felt stable for dc. She stated her nausea was much improved.   We discussed dc instructions.   BP 112/71 (BP Location: Left Arm)   Pulse 94   Temp 98.4 F (36.9 C) (Oral)   Resp 18   Ht 5\' 7"  (1.702 m)   Wt 65.8 kg (145 lb)   LMP 07/22/2016   SpO2 93%   BMI 22.71 kg/m   Gen: alert, NAD, non-toxic appearing Pupils: equal, no scleral icterus Pulm: Lungs clear to auscultation, symmetric chest rise CV: regular rate and rhythm Abd: soft, approp mild tender, nondistended. No cellulitis. No incisional hernia Ext: no edema, no calf tenderness Skin: no rash, no jaundice    Discharge Instructions  Discharge Instructions    Call MD for:    Complete by:  As directed    Temperature >101   Call MD for:  hives    Complete by:  As directed    Call MD for:  persistant dizziness or light-headedness    Complete by:  As directed    Call MD for:  persistant nausea and vomiting    Complete by:  As directed    Call MD for:  redness, tenderness, or signs of infection (pain, swelling, redness, odor or green/yellow discharge around incision site)    Complete by:  As directed    Call MD for:  severe uncontrolled pain    Complete by:  As directed    Diet general    Complete by:  As directed    Discharge instructions    Complete by:  As directed    See [facility] discharge instructions   Increase activity slowly    Complete by:  As directed      Allergies as of 08/02/2016   No Known Allergies     Medication List    TAKE these medications   amoxicillin-clavulanate 875-125 MG tablet Commonly known as:  AUGMENTIN Take 1 tablet by mouth 2 (two) times daily.   oxyCODONE 5 MG immediate release tablet Commonly known as:  Oxy IR/ROXICODONE Take 1-2 tablets (5-10 mg total) by mouth every 4 (four) hours as needed for moderate  pain or severe pain.      Follow-up Information    [facility] [place]. Go on 08/14/2016.   Specialty:  General Surgery Why:  Your appointment is 08/14/16 at 1:30 pm. Please arrive at least 30 min before your appointment to complete your check in paperwork. Contact information: [address] [place] [address] [phone]           The results of significant diagnostics from this hospitalization (including imaging, microbiology, ancillary and laboratory) are listed below for reference.    Significant Diagnostic Studies: Ct Abdomen Pelvis W Contrast  Result Date: 07/29/2016 CLINICAL DATA:  Lower abdomen and pelvic pain for 3 days with nausea EXAM: CT ABDOMEN AND PELVIS WITH CONTRAST TECHNIQUE: Multidetector CT imaging of the abdomen and pelvis was performed using the standard protocol following bolus administration of intravenous contrast. CONTRAST:  ISOVUE-300 IOPAMIDOL (ISOVUE-300) INJECTION 61% COMPARISON:  01/29/2010, CT 06/01/2009 FINDINGS: Lower chest: Lung bases demonstrate no acute consolidation or pleural effusion. Normal heart size. Hepatobiliary: No focal hepatic abnormality. Surgical clips in the gallbladder fossa. No biliary dilatation Pancreas: Unremarkable. No pancreatic ductal dilatation or surrounding inflammatory changes. Spleen: Normal in size without focal abnormality. Adrenals/Urinary Tract: Adrenal glands are within normal limits. Subcentimeter hypodensities in the kidneys too small to further characterize. Multiple punctate stones in the right kidney. Multiple small stones in the left kidney, largest is seen in the upper pole and measures 3 mm. Bladder negative. Stomach/Bowel: Stomach within normal limits. No dilated small bowel. Significant right lower quadrant inflammation. Markedly dilated appendix, measuring up to 2 cm with hyperdensity in the proximal lumen measuring 2 cm, possibly a faintly calcified stone. No  extraluminal gas collection. Vascular/Lymphatic: Non aneurysmal aorta. No significantly enlarged lymph nodes. Reproductive: Uterus and bilateral adnexa are unremarkable. 15 mm cyst left adnexa Other: No free air.  Small amount of free fluid in the pelvis Musculoskeletal: No acute or significant osseous findings. IMPRESSION: 1. Markedly enlarged appendix up to 2 cm with oval intraluminal density measuring up to 2 cm suspicious for an appendicolith. Significant surrounding inflammatory changes in the right lower quadrant and the findings are consistent with an acute appendicitis. No extraluminal gas collections at this time. No evidence for a pelvic abscess. 2. Multiple nonobstructing stones within the bilateral kidneys. Electronically Signed   By: Jasmine Pang M.D.   On: 07/29/2016 20:46    Microbiology: Recent Results (from the past 240 hour(s))  Urine culture     Status: Abnormal   Collection Time: 07/29/16  5:26 PM  Result Value Ref Range Status   Specimen Description URINE, RANDOM  Final   Special Requests NONE  Final   Culture MULTIPLE SPECIES PRESENT, SUGGEST RECOLLECTION (A)  Final   Report Status 07/31/2016 FINAL  Final  Surgical PCR screen     Status: Abnormal  Collection Time: 07/30/16  2:56 AM  Result Value Ref Range Status   MRSA, PCR NEGATIVE NEGATIVE Final   Staphylococcus aureus POSITIVE (A) NEGATIVE Final    Comment:        The Xpert SA Assay (FDA approved for NASAL specimens in patients over 42 years of age), is one component of a comprehensive surveillance program.  Test performance has been validated by [facility] for patients greater than or equal to 42 year old. It is not intended to diagnose infection nor to guide or monitor treatment.      Labs: Basic Metabolic Panel:  Recent Labs Lab 07/29/16 1930 08/02/16 0530  NA 137 139  K 3.7 3.3*  CL 103 110  CO2 19* 20*  GLUCOSE 81 102*  BUN 10 12  CREATININE 0.99 0.67  CALCIUM 8.5* 8.1*   Liver Function  Tests:  Recent Labs Lab 07/29/16 1930  AST 13*  ALT 8*  ALKPHOS 84  BILITOT 0.7  PROT 7.2  ALBUMIN 3.9    Recent Labs Lab 07/29/16 1930  LIPASE 16   No results for input(s): AMMONIA in the last 168 hours. CBC:  Recent Labs Lab 07/29/16 1930 07/31/16 0648 08/02/16 0530  WBC 12.3* 10.7* 14.5*  NEUTROABS 10.8*  --   --   HGB 12.0 10.8* 11.2*  HCT 35.8* 32.8* 33.6*  MCV 96.0 94.5 93.6  PLT 294 285 309   Cardiac Enzymes: No results for input(s): CKTOTAL, CKMB, CKMBINDEX, TROPONINI in the last 168 hours. BNP: BNP (last 3 results) No results for input(s): BNP in the last 8760 hours.  ProBNP (last 3 results) No results for input(s): PROBNP in the last 8760 hours.  CBG: No results for input(s): GLUCAP in the last 168 hours.  Active Problems:   Appendicitis   Acute appendicitis   Time coordinating discharge: 15 min  Signed:  Atilano InaEric M Alpha Chouinard, MD [facility] [place] [phone] 08/02/2016, 9:48 AM

## 2016-08-02 NOTE — Progress Notes (Signed)
   Progress Note: Acute Care Surgery Service   Chief Complaint/Subjective: Nurse paged me stating pt vomited up meal from earlier today.   Objective: Vital signs in last 24 hours: Temp:  [98 F (36.7 C)-98.8 F (37.1 C)] 98.8 F (37.1 C) (07/28 1308) Pulse Rate:  [94-107] 107 (07/28 1308) Resp:  [16-18] 16 (07/28 1308) BP: (109-120)/(53-71) 109/53 (07/28 1308) SpO2:  [93 %-98 %] 94 % (07/28 1308) Last BM Date: 08/02/16  Intake/Output from previous day: 07/27 0701 - 07/28 0700 In: 240 [P.O.:240] Out: -  Intake/Output this shift: Total I/O In: 120 [P.O.:120] Out: -   See exam from this am  Lab Results: CBC   Recent Labs  07/31/16 0648 08/02/16 0530  WBC 10.7* 14.5*  HGB 10.8* 11.2*  HCT 32.8* 33.6*  PLT 285 309   BMET  Recent Labs  08/02/16 0530  NA 139  K 3.3*  CL 110  CO2 20*  GLUCOSE 102*  BUN 12  CREATININE 0.67  CALCIUM 8.1*   PT/INR No results for input(s): LABPROT, INR in the last 72 hours. ABG No results for input(s): PHART, HCO3 in the last 72 hours.  Invalid input(s): PCO2, PO2  Studies/Results:  Anti-infectives: Anti-infectives    Start     Dose/Rate Route Frequency Ordered Stop   08/01/16 1200  amoxicillin-clavulanate (AUGMENTIN) 875-125 MG per tablet 1 tablet     1 tablet Oral Every 12 hours 08/01/16 1004     07/31/16 0000  amoxicillin-clavulanate (AUGMENTIN) 875-125 MG tablet     1 tablet Oral 2 times daily 07/31/16 0851     07/30/16 0200  metroNIDAZOLE (FLAGYL) IVPB 500 mg  Status:  Discontinued     500 mg 100 mL/hr over 60 Minutes Intravenous Every 8 hours 07/30/16 0003 08/01/16 1004   07/30/16 0100  cefTRIAXone (ROCEPHIN) 2 g in dextrose 5 % 50 mL IVPB  Status:  Discontinued     2 g 100 mL/hr over 30 Minutes Intravenous Every 24 hours 07/30/16 0003 08/01/16 1004   07/29/16 2115  piperacillin-tazobactam (ZOSYN) IVPB 3.375 g     3.375 g 100 mL/hr over 30 Minutes Intravenous  Once 07/29/16 2107 07/29/16 2201       Medications: Scheduled Meds: . acetaminophen  650 mg Oral Q6H  . amoxicillin-clavulanate  1 tablet Oral Q12H  . Chlorhexidine Gluconate Cloth  6 each Topical Q0600  . methocarbamol  500 mg Oral TID  . mupirocin ointment  1 application Nasal BID  . naproxen  500 mg Oral BID WC   Continuous Infusions: . dextrose 5 % and 0.45 % NaCl with KCl 20 mEq/L     PRN Meds:.HYDROmorphone (DILAUDID) injection, metoCLOPramide (REGLAN) injection, ondansetron (ZOFRAN) IV, ondansetron, oxyCODONE  Assessment/Plan: Patient Active Problem List   Diagnosis Date Noted  . Acute appendicitis 07/30/2016  . Appendicitis 07/29/2016  . Gastroparesis 03/27/2010  . Chronic epigastric pain 03/27/2010  . IBS (irritable bowel syndrome) 03/27/2010  . Chronic diarrhea 03/27/2010  . Normocytic anemia 03/27/2010  . Transaminitis 03/27/2010  . Elevated serum creatinine 03/27/2010  . HEMATOCHEZIA 02/12/2010  . ABDOMINAL PAIN, LOWER 02/12/2010   s/p Procedure(s): APPENDECTOMY LAPAROSCOPIC 07/30/2016  Persistent n/v.   Cancel discharge Re-start IV fluids Back diet back to fulls Check abd xray Check labs in am  Disposition: not safe for dc. Could be developing ileus.   LOS: 4 days    Atilano InaWILSON,Lynann Demetrius M, MD [phone] [facility]

## 2016-08-02 NOTE — Progress Notes (Signed)
Pt vomitted this morning, still unable to tolerate more than a few bites of food. MD paged.

## 2016-08-03 ENCOUNTER — Inpatient Hospital Stay (HOSPITAL_COMMUNITY): Payer: Self-pay

## 2016-08-03 DIAGNOSIS — R410 Disorientation, unspecified: Secondary | ICD-10-CM

## 2016-08-03 DIAGNOSIS — G9340 Encephalopathy, unspecified: Secondary | ICD-10-CM | POA: Diagnosis present

## 2016-08-03 DIAGNOSIS — R4182 Altered mental status, unspecified: Secondary | ICD-10-CM

## 2016-08-03 DIAGNOSIS — R109 Unspecified abdominal pain: Secondary | ICD-10-CM

## 2016-08-03 LAB — CBC
HCT: 32.5 % — ABNORMAL LOW (ref 36.0–46.0)
HEMATOCRIT: 31.3 % — AB (ref 36.0–46.0)
HEMOGLOBIN: 10.9 g/dL — AB (ref 12.0–15.0)
HEMOGLOBIN: 10.6 g/dL — AB (ref 12.0–15.0)
MCH: 30.6 pg (ref 26.0–34.0)
MCH: 30.9 pg (ref 26.0–34.0)
MCHC: 33.5 g/dL (ref 30.0–36.0)
MCHC: 33.9 g/dL (ref 30.0–36.0)
MCV: 91.3 fL (ref 78.0–100.0)
MCV: 91.3 fL (ref 78.0–100.0)
PLATELETS: 297 10*3/uL (ref 150–400)
PLATELETS: 270 10*3/uL (ref 150–400)
RBC: 3.56 MIL/uL — AB (ref 3.87–5.11)
RBC: 3.43 MIL/uL — AB (ref 3.87–5.11)
RDW: 14.9 % (ref 11.5–15.5)
RDW: 14.8 % (ref 11.5–15.5)
WBC: 11.6 10*3/uL — AB (ref 4.0–10.5)
WBC: 10.6 10*3/uL — AB (ref 4.0–10.5)

## 2016-08-03 LAB — AMYLASE: Amylase: 10 U/L — ABNORMAL LOW (ref 28–100)

## 2016-08-03 LAB — RAPID URINE DRUG SCREEN, HOSP PERFORMED
AMPHETAMINES: NOT DETECTED
BENZODIAZEPINES: POSITIVE — AB
Barbiturates: NOT DETECTED
Cocaine: NOT DETECTED
Opiates: POSITIVE — AB
TETRAHYDROCANNABINOL: NOT DETECTED

## 2016-08-03 LAB — URINALYSIS, ROUTINE W REFLEX MICROSCOPIC
BILIRUBIN URINE: NEGATIVE
Glucose, UA: NEGATIVE mg/dL
HGB URINE DIPSTICK: NEGATIVE
KETONES UR: NEGATIVE mg/dL
LEUKOCYTES UA: NEGATIVE
NITRITE: NEGATIVE
PH: 6 (ref 5.0–8.0)
Protein, ur: 30 mg/dL — AB
SPECIFIC GRAVITY, URINE: 1.016 (ref 1.005–1.030)
SQUAMOUS EPITHELIAL / LPF: NONE SEEN

## 2016-08-03 LAB — GLUCOSE, CAPILLARY
GLUCOSE-CAPILLARY: 159 mg/dL — AB (ref 65–99)
Glucose-Capillary: 142 mg/dL — ABNORMAL HIGH (ref 65–99)

## 2016-08-03 LAB — LACTIC ACID, PLASMA: Lactic Acid, Venous: 1.9 mmol/L (ref 0.5–1.9)

## 2016-08-03 LAB — BASIC METABOLIC PANEL
Anion gap: 10 (ref 5–15)
BUN: 16 mg/dL (ref 6–20)
CHLORIDE: 111 mmol/L (ref 101–111)
CO2: 21 mmol/L — AB (ref 22–32)
CREATININE: 0.65 mg/dL (ref 0.44–1.00)
Calcium: 7.9 mg/dL — ABNORMAL LOW (ref 8.9–10.3)
GFR calc non Af Amer: >60 mL/min (ref >60)
GLUCOSE: 147 mg/dL — AB (ref 65–99)
Potassium: 3.5 mmol/L (ref 3.5–5.1)
Sodium: 142 mmol/L (ref 135–145)

## 2016-08-03 LAB — MAGNESIUM: Magnesium: 2 mg/dL (ref 1.7–2.4)

## 2016-08-03 LAB — MRSA PCR SCREENING: MRSA BY PCR: NEGATIVE

## 2016-08-03 LAB — PROTIME-INR
INR: 3.28
Prothrombin Time: 34.1 seconds — ABNORMAL HIGH (ref 11.4–15.2)

## 2016-08-03 LAB — BLOOD GAS, ARTERIAL
ACID-BASE DEFICIT: 0.5 mmol/L (ref 0.0–2.0)
BICARBONATE: 22.8 mmol/L (ref 20.0–28.0)
Drawn by: 232811
FIO2: 21
O2 Saturation: 94.6 %
PATIENT TEMPERATURE: 98.5
PO2 ART: 73 mmHg — AB (ref 83.0–108.0)
pCO2 arterial: 34.2 mmHg (ref 32.0–48.0)
pH, Arterial: 7.439 (ref 7.350–7.450)

## 2016-08-03 LAB — AMMONIA: AMMONIA: 131 umol/L — AB (ref 9–35)

## 2016-08-03 LAB — ACETAMINOPHEN LEVEL: Acetaminophen (Tylenol), Serum: 13 ug/mL (ref 10–30)

## 2016-08-03 LAB — LIPASE, BLOOD: LIPASE: 33 U/L (ref 11–51)

## 2016-08-03 MED ORDER — LACTULOSE 10 GM/15ML PO SOLN
20.0000 g | Freq: Three times a day (TID) | ORAL | Status: DC
  Filled 2016-08-03: qty 30

## 2016-08-03 MED ORDER — ACETYLCYSTEINE LOAD VIA INFUSION
150.0000 mg/kg | Freq: Once | INTRAVENOUS | Status: DC
  Filled 2016-08-03: qty 247

## 2016-08-03 MED ORDER — IOPAMIDOL (ISOVUE-300) INJECTION 61%
INTRAVENOUS | Status: AC
Start: 2016-08-03 — End: 2016-08-04
  Administered 2016-08-04: 30 mL via ORAL
  Filled 2016-08-03: qty 30

## 2016-08-03 MED ORDER — ACETAMINOPHEN 325 MG PO TABS: 650.0000 mg | ORAL_TABLET | Freq: Four times a day (QID) | ORAL | Status: DC | PRN

## 2016-08-03 MED ORDER — PIPERACILLIN-TAZOBACTAM 3.375 G IVPB
3.3750 g | Freq: Three times a day (TID) | INTRAVENOUS | Status: DC
  Administered 2016-08-03 – 2016-08-10 (×21): 3.375 g via INTRAVENOUS
  Filled 2016-08-03 (×22): qty 50

## 2016-08-03 MED ORDER — ACETYLCYSTEINE LOAD VIA INFUSION
150.0000 mg/kg | Freq: Once | INTRAVENOUS | Status: AC
  Administered 2016-08-03: 9870 mg via INTRAVENOUS
  Filled 2016-08-03: qty 247

## 2016-08-03 MED ORDER — PIPERACILLIN-TAZOBACTAM 3.375 G IVPB: 3.3750 g | Freq: Once | INTRAVENOUS | Status: DC

## 2016-08-03 MED ORDER — IOPAMIDOL (ISOVUE-300) INJECTION 61%
30.0000 mL | Freq: Once | INTRAVENOUS | Status: AC | PRN
Start: 2016-08-03 — End: 2016-08-04
  Administered 2016-08-04 (×2): 30 mL via ORAL

## 2016-08-03 MED ORDER — VANCOMYCIN HCL IN DEXTROSE 1-5 GM/200ML-% IV SOLN
1000.0000 mg | Freq: Once | INTRAVENOUS | Status: AC
Start: 2016-08-03 — End: 2016-08-04
  Administered 2016-08-04: 1000 mg via INTRAVENOUS
  Filled 2016-08-03: qty 200

## 2016-08-03 MED ORDER — DEXTROSE 5 % IV SOLN
15.0000 mg/kg/h | INTRAVENOUS | Status: DC
  Filled 2016-08-03: qty 200

## 2016-08-03 MED ORDER — ENOXAPARIN SODIUM 40 MG/0.4ML ~~LOC~~ SOLN: 40.0000 mg | SUBCUTANEOUS | Status: DC

## 2016-08-03 NOTE — Progress Notes (Signed)
Assessed patient during report. Patient is still very lethargic, barely responds with verbal contact. Family at bedside very concerned about patient change in overall status. Family states patient was ambulating Friday after surgery and completely awake. Pt stomach more distended today. Labs are significantly elevated for AST, Ammonia, Bili. CT of head today was negative. Discussed patient status with house supervisor and agreeable that patient should be monitored more closely downstairs in stepdown since she is so lethargic and such a change in overall status. Provider Dr.Toth paged to notify of labs being elevated by V.Mayford KnifeWilliams,. RN and paged again to notify that house sup does feel that moving patient to stepdown for closer monitoring is appropriate. Rapid Response on floor to assess patient now. Will c/t monitor.

## 2016-08-03 NOTE — Progress Notes (Signed)
Pt remains lethargic at this time. Upon waking up to take med, pt noted to be very shaky and unable to hold arms up or hold pil cup in her hands. Dr, Carolynne Edouardoth made aware. Surgeon gave no new orders at the time. Vitals wnl. Family at bedside and reported pt has had episodes of low blood sugars in the past. cbg done. Wnl. Will continue to monitor pt. Derinda SisVera Jaydn Moscato,rn.

## 2016-08-03 NOTE — Consult Note (Signed)
Reason for consult: abdominal pain and abnormal liver enzymes  In summary: 41 yo F with remote alcohol drinking Hx and pancreatitis. Presented on 7/25 with abdominal pain found to have acute appendicitis for which has laparoscopic appendectomy. Postoperative course complicated by alterted mental status and elevation of LFTs and ammonia level  On my assessment: Non-focal neuro exam, disoriented and agitated S1S2 regular no murmur Clear lung exam Abdomen: distended, no rigidity, tender mainly on the right upper quadrant Warm and well perfused   I reviewed labs and imaging:  Patient critically ill in the ICU and I am managing the patient for: Acute liver injury for unclear etiology  She has marked elevation of AST with normal ALT. Increase total /direct bilirubin I reviewed meds and she did not receive high doses of acetaminophen. No recent drinking Hx that could increase her susceptibility for acetaminophen toxicity. She not on home meds also.  Will get CT of the abdomen and pelvis. And I elected to start NAC protocol for it s benefit with non-acetaminophen induced acute liver injury. Also send cultures and start broad abx. Patient will need viral panel and urine tox screen.  Lactulose for hyperammoniemia Will follow the trend on the liver functions  I spent 45 min of critical care time managing the patient and discussing with the family

## 2016-08-03 NOTE — Progress Notes (Addendum)
Patient ID: Valerie Mitchell, female   DOB: 10-11-1975, 41 y.o.   MRN: [id number]   Acute Care Surgery Service Progress Note:    Chief Complaint/Subjective: Discharged was cancelled yesterday due to vomiting. She is somnolent this am but responds to some questions. Still having some discomfort and nods to both flatus and vomiting. But o/w doesn't really talk  Objective: Vital signs in last 24 hours: Temp:  [98.4 F (36.9 C)-99.4 F (37.4 C)] 98.4 F (36.9 C) (07/29 0443) Pulse Rate:  [106-113] 106 (07/29 0443) Resp:  [16-18] 18 (07/29 0443) BP: (109-122)/(53-79) 122/79 (07/29 0443) SpO2:  [94 %-97 %] 97 % (07/29 0443) Last BM Date: 08/01/16  Intake/Output from previous day: 07/28 0701 - 07/29 0700 In: 1406.3 [P.O.:270; I.V.:1136.3] Out: -  Intake/Output this shift: No intake/output data recorded.  Lungs: cta, nonlabored  Cardiovascular: reg  Abd: soft, min distension, min TTP, incisions c/d/i  Extremities: no edema, +SCDs  Neuro: somnolent, nonfocal, pupils equal.   Lab Results: CBC   Recent Labs  08/02/16 0530 08/03/16 0536  WBC 14.5* 11.6*  HGB 11.2* 10.9*  HCT 33.6* 32.5*  PLT 309 297   BMET  Recent Labs  08/02/16 0530 08/03/16 0536  NA 139 142  K 3.3* 3.5  CL 110 111  CO2 20* 21*  GLUCOSE 102* 147*  BUN 12 16  CREATININE 0.67 0.65  CALCIUM 8.1* 7.9*   LFT Hepatic Function Latest Ref Rng & Units 07/29/2016 03/20/2010 03/07/2010  Total Protein 6.5 - 8.1 g/dL 7.2 - 6.7  Albumin 3.5 - 5.0 g/dL 3.9 4.3 3.9  AST 15 - 41 U/L 13(L) 12 40(H)  ALT 14 - 54 U/L 8(L) 14 36(H)  Alk Phosphatase 38 - 126 U/L 84 95 77  Total Bilirubin 0.3 - 1.2 mg/dL 0.7 0.3 0.2(L)  Bilirubin, Direct 0.01 - 0.4 mg/dL - 0.1 0.1   PT/INR No results for input(s): LABPROT, INR in the last 72 hours. ABG No results for input(s): PHART, HCO3 in the last 72 hours.  Invalid input(s): PCO2, PO2  Studies/Results:  Anti-infectives: Anti-infectives    Start     Dose/Rate  Route Frequency Ordered Stop   08/01/16 1200  amoxicillin-clavulanate (AUGMENTIN) 875-125 MG per tablet 1 tablet     1 tablet Oral Every 12 hours 08/01/16 1004     07/31/16 0000  amoxicillin-clavulanate (AUGMENTIN) 875-125 MG tablet     1 tablet Oral 2 times daily 07/31/16 0851     07/30/16 0200  metroNIDAZOLE (FLAGYL) IVPB 500 mg  Status:  Discontinued     500 mg 100 mL/hr over 60 Minutes Intravenous Every 8 hours 07/30/16 0003 08/01/16 1004   07/30/16 0100  cefTRIAXone (ROCEPHIN) 2 g in dextrose 5 % 50 mL IVPB  Status:  Discontinued     2 g 100 mL/hr over 30 Minutes Intravenous Every 24 hours 07/30/16 0003 08/01/16 1004   07/29/16 2115  piperacillin-tazobactam (ZOSYN) IVPB 3.375 g     3.375 g 100 mL/hr over 30 Minutes Intravenous  Once 07/29/16 2107 07/29/16 2201      Medications: Scheduled Meds: . acetaminophen  650 mg Oral Q6H  . amoxicillin-clavulanate  1 tablet Oral Q12H  . Chlorhexidine Gluconate Cloth  6 each Topical Q0600  . mupirocin ointment  1 application Nasal BID  . naproxen  500 mg Oral BID WC   Continuous Infusions: . dextrose 5 % and 0.45 % NaCl with KCl 20 mEq/L 75 mL/hr at 08/02/16 1551   PRN Meds:.HYDROmorphone (DILAUDID) injection, metoCLOPramide (  REGLAN) injection, ondansetron (ZOFRAN) IV, ondansetron, oxyCODONE  Assessment/Plan: Patient Active Problem List   Diagnosis Date Noted  . Acute appendicitis 07/30/2016  . Appendicitis 07/29/2016  . Gastroparesis 03/27/2010  . Chronic epigastric pain 03/27/2010  . IBS (irritable bowel syndrome) 03/27/2010  . Chronic diarrhea 03/27/2010  . Normocytic anemia 03/27/2010  . Transaminitis 03/27/2010  . Elevated serum creatinine 03/27/2010  . HEMATOCHEZIA 02/12/2010  . ABDOMINAL PAIN, LOWER 02/12/2010   s/p Procedure(s): APPENDECTOMY LAPAROSCOPIC 07/30/2016  abd xray showed no evidence of bowel obstruction or possible ileus.  She is afebrile. Wbc decreasing today. So will cont with oral abx.   Somnolence -  not sure etiology. It is not c/w a neurological or resp event. She was getting scheduled robaxin which may be contributing so I stopped that. Will monitor. Add pulse ox.  Hasn't received any opoids for >24hrs. Also on scheduled tylenol will change to PRN. If somnolence doesn't improve by mid day will check lfts/acetaminophen level.   Disposition: not ready for discharge.    LOS: 5 days    Leighton Ruff. Redmond Pulling, MD, FACS General, Bariatric, & Minimally Invasive Surgery [phone] [facility]

## 2016-08-03 NOTE — Progress Notes (Signed)
Pharmacy Antibiotic Note  Valerie AdlerStephanie R Mitchell is a 41 y.o. female admitted on 07/29/2016 with abdominal pain s/p appendectomy on 7/25.  She has been on antibiotics post-op for coverage of intra-abdominal infection.   She was to be discharged on Augmentin, however this was canceled due to increased nausea and possible bowel obstruction vs ileus.  Oral meds have been discontinued.  Pharmacy has been consulted for Zosyn dosing.  Plan: Zosyn 3.375g IV q8h (4 hour infusion).  Consider narrow spectrum to intra-abdominal coverage only with Unasyn if appropriate.  No dose adjustments anticipated- pharmacy will sign off.   Height: 5\' 7"  (170.2 cm) Weight: 145 lb (65.8 kg) IBW/kg (Calculated) : 61.6  Temp (24hrs), Avg:98.9 F (37.2 C), Min:98.4 F (36.9 C), Max:99.4 F (37.4 C)   Recent Labs Lab 07/29/16 1930 07/29/16 1947 07/31/16 0648 08/02/16 0530 08/03/16 0536  WBC 12.3*  --  10.7* 14.5* 11.6*  CREATININE 0.99  --   --  0.67 0.65  LATICACIDVEN  --  1.82  --   --   --     Estimated Creatinine Clearance: 90 mL/min (by C-G formula based on SCr of 0.65 mg/dL).    No Known Allergies  Antimicrobials this admission: 7/24 Zosyn x1; 7/29>> 7/25 Rocephin + Flagyl>>7/27 7/27 Augmentin>>7/29  Microbiology results: 7/24 UCx: multiple sp, recollect  7/24 Surgical PCR: +SA, -MRSA  Thank you for allowing pharmacy to be a part of this patient's care.  Valerie ClanLilliston, Valerie Mitchell 08/03/2016 11:48 AM

## 2016-08-03 NOTE — Progress Notes (Signed)
eLink Physician-Brief Progress Note Patient Name: Valerie AdlerStephanie R Mitchell DOB: Jun 25, 1975 MRN: [id number]   Date of Service  08/03/2016  HPI/Events of Note  Pt s/p Appy and now with AMS and elevated LFTs and ammonia  eICU Interventions  trf to ICU, place NG and give lactulose Needs full w/u of acute LFT abn. See full ICU consult to follow     Intervention Category Evaluation Type: New Patient Evaluation  Valerie Mitchell 08/03/2016, 8:44 PM

## 2016-08-03 NOTE — Progress Notes (Signed)
Callback from Dr. Carolynne Edouardoth, advised of labs and patient status and that Rapid Response suggests moving patient as well. Orders given to transfer patient to ICU. Family at beside notified of patient transfer. Patient belongings collected by family and patient to be transferred once bed is assigned.

## 2016-08-03 NOTE — Progress Notes (Signed)
Pharmacy Antibiotic Note  Valerie Mitchell is a 41 y.o. female admitted on 07/29/2016 with sepsis and IAI.  Pharmacy has been consulted for vancomycin and zosyn dosing. She is s/p appendectomy on 7/25. Earlier today her PO augmentin was changed to Zosyn and now vancomycin is to be added. Wt 65.8 kg. WBC 11.6, creat 0.65. MRSA PCR neg.   Plan: vancomycin 1 gm IV x 1 then Vancomycin 750 mg IV every 8 hours.  Goal trough 15-20 mcg/mL. Zosyn 3.375g IV q8h (4 hour infusion).  F/u renal fxn, WBC, temp, culture data Vancomycin levels as needed  Height: 5\' 7"  (170.2 cm) Weight: 145 lb (65.8 kg) IBW/kg (Calculated) : 61.6  Temp (24hrs), Avg:98.4 F (36.9 C), Min:97.7 F (36.5 C), Max:98.9 F (37.2 C)   Recent Labs Lab 07/29/16 1930 07/29/16 1947 07/31/16 0648 08/02/16 0530 08/03/16 0536 08/03/16 2156  WBC 12.3*  --  10.7* 14.5* 11.6*  --   CREATININE 0.99  --   --  0.67 0.65  --   LATICACIDVEN  --  1.82  --   --   --  1.9    Estimated Creatinine Clearance: 90 mL/min (by C-G formula based on SCr of 0.65 mg/dL).    No Known Allergies Antimicrobials this admission: 7/24 Zosyn x1; 7/29>> 7/25 Rocephin + Flagyl>>7/27 7/27 Augmentin>>7/29 7/29 vanc 7/29>>  Microbiology results: 7/24 UCx: multiple sp, recollect  7/24 Surgical PCR: +SA, -MRSA 7/29 MRSA PCR NEG 7/29 BCx2>> 7/29 Ucx>>   Thank you for allowing pharmacy to be a part of this patient's care.  Herby AbrahamMichelle T. Bladimir Auman, Pharm.D. [phone] 08/03/2016 11:18 PM

## 2016-08-03 NOTE — Progress Notes (Signed)
Report given to Sarah, patient transferred with Rapid Response assistance. Patient is responding to verbal commands at times. CBG 159, temp 98.5.Vitals are stable. Will c/t monitor.

## 2016-08-04 ENCOUNTER — Inpatient Hospital Stay (HOSPITAL_COMMUNITY): Payer: Self-pay

## 2016-08-04 ENCOUNTER — Encounter (HOSPITAL_COMMUNITY): Payer: Self-pay

## 2016-08-04 DIAGNOSIS — R6520 Severe sepsis without septic shock: Secondary | ICD-10-CM

## 2016-08-04 DIAGNOSIS — N731 Chronic parametritis and pelvic cellulitis: Secondary | ICD-10-CM

## 2016-08-04 DIAGNOSIS — A419 Sepsis, unspecified organism: Secondary | ICD-10-CM

## 2016-08-04 DIAGNOSIS — K3580 Unspecified acute appendicitis: Secondary | ICD-10-CM

## 2016-08-04 DIAGNOSIS — K7200 Acute and subacute hepatic failure without coma: Secondary | ICD-10-CM

## 2016-08-04 DIAGNOSIS — G9340 Encephalopathy, unspecified: Secondary | ICD-10-CM

## 2016-08-04 DIAGNOSIS — N739 Female pelvic inflammatory disease, unspecified: Secondary | ICD-10-CM

## 2016-08-04 LAB — GAMMA GT: GGT: 398 U/L — ABNORMAL HIGH (ref 7–50)

## 2016-08-04 LAB — BASIC METABOLIC PANEL
Anion gap: 9 (ref 5–15)
BUN: 11 mg/dL (ref 6–20)
CO2: 22 mmol/L (ref 22–32)
CREATININE: 0.62 mg/dL (ref 0.44–1.00)
Calcium: 7.6 mg/dL — ABNORMAL LOW (ref 8.9–10.3)
Chloride: 111 mmol/L (ref 101–111)
GFR calc Af Amer: >60 mL/min (ref >60)
GLUCOSE: 184 mg/dL — AB (ref 65–99)
POTASSIUM: 3.2 mmol/L — AB (ref 3.5–5.1)
Sodium: 142 mmol/L (ref 135–145)

## 2016-08-04 LAB — CREATININE, SERUM
CREATININE: 0.53 mg/dL (ref 0.44–1.00)
GFR calc Af Amer: >60 mL/min (ref >60)

## 2016-08-04 LAB — CBC
HCT: 32.9 % — ABNORMAL LOW (ref 36.0–46.0)
Hemoglobin: 11.1 g/dL — ABNORMAL LOW (ref 12.0–15.0)
MCH: 30.4 pg (ref 26.0–34.0)
MCHC: 33.7 g/dL (ref 30.0–36.0)
MCV: 90.1 fL (ref 78.0–100.0)
Platelets: 269 10*3/uL (ref 150–400)
RBC: 3.65 MIL/uL — ABNORMAL LOW (ref 3.87–5.11)
RDW: 14.8 % (ref 11.5–15.5)
WBC: 10.5 10*3/uL (ref 4.0–10.5)

## 2016-08-04 LAB — HEPATIC FUNCTION PANEL
ALBUMIN: 2.7 g/dL — AB (ref 3.5–5.0)
ALK PHOS: 299 U/L — AB (ref 38–126)
ALT: 3934 U/L — ABNORMAL HIGH (ref 14–54)
AST: 1186 U/L — AB (ref 15–41)
BILIRUBIN DIRECT: 1.9 mg/dL — AB (ref 0.1–0.5)
BILIRUBIN TOTAL: 2.7 mg/dL — AB (ref 0.3–1.2)
Indirect Bilirubin: 0.8 mg/dL (ref 0.3–0.9)
Total Protein: 5.2 g/dL — ABNORMAL LOW (ref 6.5–8.1)

## 2016-08-04 LAB — PHOSPHORUS: Phosphorus: <1 mg/dL — CL (ref 2.5–4.6)

## 2016-08-04 LAB — CK: CK TOTAL: 26 U/L — AB (ref 38–234)

## 2016-08-04 LAB — PROTIME-INR
INR: 2.84
PROTHROMBIN TIME: 30.4 s — AB (ref 11.4–15.2)

## 2016-08-04 LAB — MAGNESIUM: MAGNESIUM: 1.8 mg/dL (ref 1.7–2.4)

## 2016-08-04 LAB — LACTIC ACID, PLASMA
LACTIC ACID, VENOUS: 2.3 mmol/L — AB (ref 0.5–1.9)
LACTIC ACID, VENOUS: 1.7 mmol/L (ref 0.5–1.9)

## 2016-08-04 LAB — ACETAMINOPHEN LEVEL: Acetaminophen (Tylenol), Serum: <10 ug/mL — ABNORMAL LOW (ref 10–30)

## 2016-08-04 MED ORDER — IOPAMIDOL (ISOVUE-300) INJECTION 61%
100.0000 mL | Freq: Once | INTRAVENOUS | Status: AC | PRN
  Administered 2016-08-04: 100 mL via INTRAVENOUS

## 2016-08-04 MED ORDER — SODIUM PHOSPHATES 45 MMOLE/15ML IV SOLN
30.0000 mmol | Freq: Once | INTRAVENOUS | Status: AC
  Administered 2016-08-04: 30 mmol via INTRAVENOUS
  Filled 2016-08-04: qty 10

## 2016-08-04 MED ORDER — IOPAMIDOL (ISOVUE-300) INJECTION 61%
INTRAVENOUS | Status: AC
  Filled 2016-08-04: qty 100

## 2016-08-04 MED ORDER — ACETYLCYSTEINE LOAD VIA INFUSION
150.0000 mg/kg | Freq: Once | INTRAVENOUS | Status: AC
  Administered 2016-08-04: 10020 mg via INTRAVENOUS
  Filled 2016-08-04: qty 251

## 2016-08-04 MED ORDER — DEXTROSE 5 % IV SOLN
15.0000 mg/kg/h | INTRAVENOUS | Status: DC
  Administered 2016-08-04 – 2016-08-05 (×3): 15 mg/kg/h via INTRAVENOUS
  Filled 2016-08-04 (×3): qty 200

## 2016-08-04 MED ORDER — MAGNESIUM SULFATE 2 GM/50ML IV SOLN
2.0000 g | Freq: Once | INTRAVENOUS | Status: AC
  Administered 2016-08-04: 2 g via INTRAVENOUS
  Filled 2016-08-04: qty 50

## 2016-08-04 MED ORDER — POTASSIUM CHLORIDE 10 MEQ/100ML IV SOLN
10.0000 meq | INTRAVENOUS | Status: AC
  Administered 2016-08-04 (×4): 10 meq via INTRAVENOUS
  Filled 2016-08-04 (×4): qty 100

## 2016-08-04 MED ORDER — VANCOMYCIN HCL IN DEXTROSE 750-5 MG/150ML-% IV SOLN
750.0000 mg | Freq: Three times a day (TID) | INTRAVENOUS | Status: DC
  Administered 2016-08-04 – 2016-08-05 (×4): 750 mg via INTRAVENOUS
  Filled 2016-08-04 (×4): qty 150

## 2016-08-04 MED ORDER — SODIUM CHLORIDE 0.9 % IV BOLUS (SEPSIS)
1000.0000 mL | Freq: Once | INTRAVENOUS | Status: AC
  Administered 2016-08-04: 1000 mL via INTRAVENOUS

## 2016-08-04 NOTE — Progress Notes (Signed)
[facility]  The patient's partner has informed us that the patient has taken at least 8 gm of Acetaminophen from her own supply while hospitalized.  This could certainly explain her liver injury.  Heber CarolinaBrent McQuaid, MD [facility] Pager: [phone] Cell: [phone] After 3pm or if no response, call [phone]

## 2016-08-04 NOTE — Consult Note (Signed)
Referring Provider:   Dr. Sheran Lawless Primary Care Physician:  Babs Sciara, MD Primary Gastroenterologist:  Dr. Dulce Sellar, Dr. Jena Gauss  Reason for Consultation:  Acute Liver injury  HPI: Valerie Mitchell is a 41 y.o. female who is 5 days status post appendectomy for perforated appendix, who was noted yesterday evening to have marked elevation of liver chemistries, which have been normal preoperatively. The patient also had altered mental status with elevated ammonia level, as well as elevation of her INR.Marland Kitchen She was given the diagnosis of acute liver injury and transferred to the stepdown ICU, where, overnight, the liver chemistries and INR have started to improve. Today, there has been a marked improvement in her mental status and alertness, from talking with both the patient's nurse and the patient's mother who is at the bedside.  There is a remote history of ethanol abuse, none for perhaps the past 8 or 10 years. In fact, she underwent an endoscopic ultrasound by Dr. Dulce Sellar to check for chronic pancreatitis about 7 years ago.  There is also a questionable history of significant Tylenol exposure prior to admission, although her level on presentation to the ICU last night was only 13.  On July 24, prior to her surgery, the patient's transaminases were under 20. Yesterday, AST was 2300 and ALT was 4500, but today they are improved at 1200 and 4000, respectively. Meanwhile, bilirubin has dropped from 3.4 to a current level of 2.7, and the patient's INR has improved from 3.3 to a current level of 2.8 overnight.  The patient is receiving IV  N-acetylcysteine.  A CT scan obtained last night shows some fatty infiltration of the liver but no evidence of cirrhosis, portal hypertension, or splenomegaly. There is questionable abscess formation in the pelvis.       Past Medical History:  Diagnosis Date  . Depression   . Pancreatitis 11/2008   likely r/t ETOH  . Pyelonephritis 2010  . Renal  insufficiency     Past Surgical History:  Procedure Laterality Date  . EGD, Dr. Ronni Rumble [facility], bile-stained gastric mucosa  03/08/10  . EUS, Dr. Sonda Primes chronic pancreatitis, duodenal erosion, gb adenomyotosis  01/10/09  . LAPAROSCOPIC APPENDECTOMY N/A 07/30/2016   Procedure: APPENDECTOMY LAPAROSCOPIC;  Surgeon: Abigail Miyamoto, MD;  Location: [facility];  Service: General;  Laterality: N/A;  . LAPAROSCOPIC CHOLECYSTECTOMY  11/11  . TCS, Dr. Elmer Ramp colon and TI  02/14/10    Prior to Admission medications   Medication Sig Start Date End Date Taking? Authorizing Provider  amoxicillin-clavulanate (AUGMENTIN) 875-125 MG tablet Take 1 tablet by mouth 2 (two) times daily. 07/31/16   Meuth, Brooke A, PA-C  oxyCODONE (OXY IR/ROXICODONE) 5 MG immediate release tablet Take 1-2 tablets (5-10 mg total) by mouth every 4 (four) hours as needed for moderate pain or severe pain. 07/31/16   Meuth, Lina Sar, PA-C    Current Facility-Administered Medications  Medication Dose Route Frequency Provider Last Rate Last Dose  . acetylcysteine (ACETADOTE) 40,000 mg in dextrose 5 % 1,000 mL (40 mg/mL) infusion  15 mg/kg/hr Intravenous Continuous Max Fickle B, MD 25.1 mL/hr at 08/04/16 1520 15 mg/kg/hr at 08/04/16 1520  . Chlorhexidine Gluconate Cloth 2 % PADS 6 each  6 each Topical Q0600 Abigail Miyamoto, MD   6 each at 08/04/16 0600  . dextrose 5 % and 0.45 % NaCl with KCl 20 mEq/L infusion   Intravenous Continuous Gaynelle Adu, MD 75 mL/hr at 08/04/16 1053    . mupirocin ointment (BACTROBAN) 2 %  1 application  1 application Nasal BID Abigail MiyamotoBlackman, Douglas, MD   1 application at 08/04/16 0945  . ondansetron (ZOFRAN) injection 4 mg  4 mg Intravenous Q6H PRN Meuth, Brooke A, PA-C   4 mg at 08/04/16 0022  . piperacillin-tazobactam (ZOSYN) IVPB 3.375 g  3.375 g Intravenous Q8H Phylliss BlakesLilliston, Andrea M, [facility]   Stopped at 08/04/16 1717  . vancomycin (VANCOCIN) IVPB 750 mg/150 ml premix  750 mg Intravenous Q8H  Storm FriskWright, Patrick E, MD   Stopped at 08/04/16 1816    Allergies as of 07/29/2016  . (No Known Allergies)    Family History  Problem Relation Age of Onset  . Colon cancer Maternal Grandfather   . Stroke Mother     Social History   Social History  . Marital status: Single    Spouse name: N/A  . Number of children: N/A  . Years of education: N/A   Occupational History  . unemployed    Social History Main Topics  . Smoking status: Former Smoker    Packs/day: 0.80    Years: 20.00    Types: Cigarettes    Quit date: 07/29/2013  . Smokeless tobacco: Never Used  . Alcohol use No  . Drug use: No  . Sexual activity: No     Comment: has a girlfriend   Other Topics Concern  . Not on file   Social History Narrative  . No narrative on file     Physical Exam: Vital signs in last 24 hours: Temp:  [97.7 F (36.5 C)-98.7 F (37.1 C)] 98.7 F (37.1 C) (07/30 1600) Pulse Rate:  [80-100] 89 (07/30 1700) Resp:  [14-31] 15 (07/30 1700) BP: (88-159)/(55-94) 134/76 (07/30 1700) SpO2:  [94 %-99 %] 96 % (07/30 1700) Weight:  [66.8 kg (147 lb 4.3 oz)] 66.8 kg (147 lb 4.3 oz) (07/30 0320) Last BM Date: 08/01/16 The patient's NG tube is draining bilious green fluid, about 600 ML's over the past shift. She is alert and pleasant. There is some slight asterixis. She knows it is July, but does not know what year it is. [facility] She does serial twos somewhat slowly and with one error.  She has no stigmata of chronic liver disease such as palmar erythema, scleral icterus, spider angiomata, or hepatosplenomegaly or peripheral edema.   Intake/Output from previous day: 07/29 0701 - 07/30 0700 In: 3749.8 [I.V.:2106.4; IV Piggyback:1643.3] Out: 3175 [Urine:3175] Intake/Output this shift: Total I/O In: 2038.5 [I.V.:1013.5; ZOXWR:604Other:425; IV Piggyback:600] Out: 5409 [WJXBJ:[id number] [Urine:2475; Emesis/NG output:600]  Lab Results:  Recent Labs  08/03/16 0536  08/03/16 2305 08/04/16 0106  WBC 11.6* 10.6* 10.5  HGB 10.9* 10.6* 11.1*  HCT 32.5* 31.3* 32.9*  PLT 297 270 269   BMET  Recent Labs  08/02/16 0530 08/03/16 0536 08/03/16 2305 08/04/16 0106  NA 139 142  --  142  K 3.3* 3.5  --  3.2*  CL 110 111  --  111  CO2 20* 21*  --  22  GLUCOSE 102* 147*  --  184*  BUN 12 16  --  11  CREATININE 0.67 0.65 0.53 0.62  CALCIUM 8.1* 7.9*  --  7.6*   LFT  Recent Labs  08/04/16 0106  PROT 5.2*  ALBUMIN 2.7*  AST 1,186*  ALT 3,934*  ALKPHOS 299*  BILITOT 2.7*  BILIDIR 1.9*  IBILI 0.8   PT/INR  Recent Labs  08/03/16 2157 08/04/16 1058  LABPROT 34.1* 30.4*  INR 3.28 2.84  Studies/Results: Dg Abd 1 View  Result Date: 08/03/2016 CLINICAL DATA:  NG tube placement EXAM: ABDOMEN - 1 VIEW COMPARISON:  08/02/2016 FINDINGS: Enteric tube terminates in the distal gastric body. Cholecystectomy clips. Contrast in the right colon. IMPRESSION: Enteric tube terminates in the distal gastric body. Electronically Signed   By: Charline Bills M.D.   On: 08/03/2016 22:40   Ct Head Wo Contrast  Result Date: 08/03/2016 CLINICAL DATA:  Altered mental status. EXAM: CT HEAD WITHOUT CONTRAST TECHNIQUE: Contiguous axial images were obtained from the base of the skull through the vertex without intravenous contrast. COMPARISON:  03/19/2010 FINDINGS: Brain: There is no evidence of acute infarct, intracranial hemorrhage, mass, midline shift, or extra-axial fluid collection. The ventricles and sulci are normal. Vascular: No hyperdense vessel. Skull: No fracture focal osseous lesion. Sinuses/Orbits: No significant inflammatory disease in the included paranasal sinuses are mastoid air cells. Unremarkable orbits. Other: None. IMPRESSION: Unremarkable head CT. Electronically Signed   By: Sebastian Ache M.D.   On: 08/03/2016 12:40   Ct Abdomen Pelvis W Contrast  Result Date: 08/04/2016 CLINICAL DATA:  41 year old female with recent appendectomy with abdominal  pain. EXAM: CT ABDOMEN AND PELVIS WITH CONTRAST TECHNIQUE: Multidetector CT imaging of the abdomen and pelvis was performed using the standard protocol following bolus administration of intravenous contrast. CONTRAST:  ISOVUE-300 IOPAMIDOL (ISOVUE-300) INJECTION 61% COMPARISON:  Abdominal CT dated 07/29/2016 FINDINGS: Lower chest: Partially visualized small bilateral pleural effusions with associated partial compressive atelectasis of the lower lobes. Pneumonia is not excluded. Clinical correlation is recommended. No intra-abdominal free air.  Small free fluid within the pelvis. Hepatobiliary: Apparent mild fatty infiltration of the liver. Cholecystectomy. There is mild dilatation of the central CBD, likely post cholecystectomy. No retained calcified stone noted within the CBD. Small amount of fluid in the in the subhepatic area and within the cholecystectomy bed. Pancreas: Unremarkable. No pancreatic ductal dilatation or surrounding inflammatory changes. Spleen: Normal in size without focal abnormality. Adrenals/Urinary Tract: The adrenal glands are unremarkable. There are small nonobstructing bilateral renal calculi as seen on the prior CT. No hydronephrosis. Left renal hypodense lesion is too small to characterize, likely a cyst. The visualized ureters appear unremarkable. There is partial distention of the urinary bladder despite presence of a Foley catheter. Small amount of air is also seen within the urinary bladder, likely introduced via Foley. Stomach/Bowel: An enteric tube is seen with tip in the distal stomach. There is no evidence of bowel obstruction. There is mild inflammatory changes and thickening of the distal small bowel loops, likely reactive. There is postsurgical changes of appendectomy. There is a small amount of loculated fluid adjacent to the appendix to appendectomy clip and extending into the pelvis. There is a 5.7 x 3.0 x 2.8 cm loculated fluid with enhancing wall in the cul-de-sac  most consistent with an infected fluid collection or developing abscess. There is an 11 x 10 x 32 mm fluid collection noted in the lower abdomen in the midline (series 2, image 61). Vascular/Lymphatic: No significant vascular findings are present. No enlarged abdominal or pelvic lymph nodes. Reproductive: Uterus and bilateral adnexa are unremarkable. Other: There is mild diffuse subcutaneous edema. There is stranding of the periumbilical subcutaneous fat with a small pocket of air, likely related to laparoscopic port. No fluid collection. Musculoskeletal: No acute or significant osseous findings. IMPRESSION: 1. Status post recent appendectomy with small loculated fluid adjacent to the appendectomy clip as well as a loculated fluid in the cul-de-sac most consistent with developing  abscesses. Suture dehiscence and leakage from the appendectomy stump is not excluded. 2. Prior cholecystectomy. Small amount of fluid in the cholecystectomy bed, likely extension of the fluid from the pelvis. Body leak as the cause of the pelvic abscess is much less likely. 3. Thickened and inflamed loops of distal small bowel, likely reactive. No bowel obstruction. 4. Fatty liver. 5. Nonobstructing bilateral renal calculi. 6. Partially visualized bilateral pleural effusions and bilateral lower lobe partial compressive atelectasis. Pneumonia is not excluded. Clinical correlation is recommended. Electronically Signed   By: Elgie CollardArash  Radparvar M.D.   On: 08/04/2016 03:30    Impression: Acute liver injury which could be multifactorial, including antecedent Tylenol exposure, anesthesia reaction, and reactive hepatopathy from pelvic sepsis. Doubt acute viral hepatitis or abrupt onset of autoimmune hepatitis.  Plan: The improving trend in the patient's labs is extremely encouraging. I would continue the acetylcysteine infusion for now and monitor labs. As long as there is continued improvement, I don't feel that any other specific  intervention therapeutically as needed, and for example, I do not feel that transfer to a liver transplant center will be needed. I will check for viral hepatitis and autoimmune hepatitis with appropriate blood studies.   LOS: 6 days   Ramon Zanders V  08/04/2016, 6:14 PM   Pager [phone] If no answer or after 5 PM call [phone]

## 2016-08-04 NOTE — Progress Notes (Signed)
Partner, Amna, showed this RN a bottle of Acetaminophen 500 mg that she bought to the hospital sealed on 7/25, the day of the patient's Appendectomy. She has counted and there are 16 pills gone from the bottle that equivalents to 8000 mg that the patient has consumed since 7/25. Dr Kendrick FriesMcQuaid made aware.

## 2016-08-04 NOTE — Progress Notes (Signed)
CRITICAL VALUE ALERT  Critical Value: Phosphorous <1  Date & Time Notied:  08/04/2016 0155  Provider Notified: Marchelle Gearingamaswamy  Orders Received/Actions taken: Orders received

## 2016-08-04 NOTE — Consult Note (Signed)
Chief Complaint: Patient was seen in consultation today for CT-guided aspiration/possible drainage of pelvic fluid collection Chief Complaint  Patient presents with  . Abdominal Pain    Referring Physician(s): [facility](Blackman,D)  Supervising Physician: Gilmer MorWagner, Jaime  [facility]  History of Present Illness: Valerie Mitchell is a 41 y.o. female with prior history of alcohol use/pancreatitis was recently admitted with abdominal pain, nausea, fever and imaging findings consistent with perforated appendicitis. She is status post laparoscopic appendectomy on 07/30/16. Postop course been complicated by elevated LFTs and altered mental status as well as elevated ammonia levels. Follow-up CT abdomen /pelvis done today reveals:  1. Status post recent appendectomy with small loculated fluid adjacent to the appendectomy clip as well as a loculated fluid in the cul-de-sac most consistent with developing abscesses. Suture dehiscence and leakage from the appendectomy stump is not excluded. 2. Prior cholecystectomy. Small amount of fluid in the cholecystectomy bed, likely extension of the fluid from the pelvis. Bile leak as the cause of the pelvic abscess is much less likely. 3. Thickened and inflamed loops of distal small bowel, likely reactive. No bowel obstruction. 4. Fatty liver. 5. Nonobstructing bilateral renal calculi. 6. Partially visualized bilateral pleural effusions and bilateral lower lobe partial compressive atelectasis. Pneumonia is not excluded. Clinical correlation is recommended  Request now received from surgery for CT-guided aspiration/possible drainage of pelvic fluid collection. Patient is currently afebrile with WBC 10.5, hemoglobin 11.1, platelets 269, PT 34.1, INR 3.28, creatinine 0.62, potassium 3.2  Past Medical History:  Diagnosis Date  . Depression   . Pancreatitis 11/2008   likely r/t ETOH  . Pyelonephritis 2010  . Renal insufficiency      Past Surgical History:  Procedure Laterality Date  . EGD, Dr. Ronni Rumbleourk-->small hh, bile-stained gastric mucosa  03/08/10  . EUS, Dr. Sonda PrimesWilliam Outlaw-->no chronic pancreatitis, duodenal erosion, gb adenomyotosis  01/10/09  . LAPAROSCOPIC APPENDECTOMY N/A 07/30/2016   Procedure: APPENDECTOMY LAPAROSCOPIC;  Surgeon: Abigail MiyamotoBlackman, Douglas, MD;  Location: [facility];  Service: General;  Laterality: N/A;  . LAPAROSCOPIC CHOLECYSTECTOMY  11/11  . TCS, Dr. Elmer Rampourk-->normal colon and TI  02/14/10    Allergies: Patient has no known allergies.  Medications: Prior to Admission medications   Medication Sig Start Date End Date Taking? Authorizing Provider  amoxicillin-clavulanate (AUGMENTIN) 875-125 MG tablet Take 1 tablet by mouth 2 (two) times daily. 07/31/16   Meuth, Brooke A, PA-C  oxyCODONE (OXY IR/ROXICODONE) 5 MG immediate release tablet Take 1-2 tablets (5-10 mg total) by mouth every 4 (four) hours as needed for moderate pain or severe pain. 07/31/16   Meuth, Lina SarBrooke A, PA-C     Family History  Problem Relation Age of Onset  . Colon cancer Maternal Grandfather   . Stroke Mother     Social History   Social History  . Marital status: Single    Spouse name: N/A  . Number of children: N/A  . Years of education: N/A   Occupational History  . unemployed    Social History Main Topics  . Smoking status: Former Smoker    Packs/day: 0.80    Years: 20.00    Types: Cigarettes    Quit date: 07/29/2013  . Smokeless tobacco: Never Used  . Alcohol use No  . Drug use: No  . Sexual activity: No     Comment: has a girlfriend   Other Topics Concern  . None   Social History Narrative  . None      Review of Systems  see above; currently without fever,HA,CP worsening dyspnea,N/V or bleeding  Vital Signs: BP (!) 134/91 (BP Location: Left Arm)   Pulse 91   Temp 98.1 F (36.7 C) (Oral)   Resp (!) 23   Ht 5\' 7"  (1.702 m)   Wt 147 lb 4.3 oz (66.8 kg)   LMP 07/22/2016 Comment: negative urein pregnancy  test 07/29/16  SpO2 97%   BMI 23.07 kg/m   Physical Exam pt lethargic, family in room; chest- sl dim BS bases; heart- RRR; abd- soft,mildly dist; few BS, tender RLQ/ant pelvic regions ; no LE edema  Mallampati Score:     Imaging: Dg Abd 1 View  Result Date: 08/03/2016 CLINICAL DATA:  NG tube placement EXAM: ABDOMEN - 1 VIEW COMPARISON:  08/02/2016 FINDINGS: Enteric tube terminates in the distal gastric body. Cholecystectomy clips. Contrast in the right colon. IMPRESSION: Enteric tube terminates in the distal gastric body. Electronically Signed   By: Charline Bills M.D.   On: 08/03/2016 22:40   Ct Head Wo Contrast  Result Date: 08/03/2016 CLINICAL DATA:  Altered mental status. EXAM: CT HEAD WITHOUT CONTRAST TECHNIQUE: Contiguous axial images were obtained from the base of the skull through the vertex without intravenous contrast. COMPARISON:  03/19/2010 FINDINGS: Brain: There is no evidence of acute infarct, intracranial hemorrhage, mass, midline shift, or extra-axial fluid collection. The ventricles and sulci are normal. Vascular: No hyperdense vessel. Skull: No fracture focal osseous lesion. Sinuses/Orbits: No significant inflammatory disease in the included paranasal sinuses are mastoid air cells. Unremarkable orbits. Other: None. IMPRESSION: Unremarkable head CT. Electronically Signed   By: Sebastian Ache M.D.   On: 08/03/2016 12:40   Ct Abdomen Pelvis W Contrast  Result Date: 08/04/2016 CLINICAL DATA:  41 year old female with recent appendectomy with abdominal pain. EXAM: CT ABDOMEN AND PELVIS WITH CONTRAST TECHNIQUE: Multidetector CT imaging of the abdomen and pelvis was performed using the standard protocol following bolus administration of intravenous contrast. CONTRAST:  ISOVUE-300 IOPAMIDOL (ISOVUE-300) INJECTION 61% COMPARISON:  Abdominal CT dated 07/29/2016 FINDINGS: Lower chest: Partially visualized small bilateral pleural effusions with associated partial compressive  atelectasis of the lower lobes. Pneumonia is not excluded. Clinical correlation is recommended. No intra-abdominal free air.  Small free fluid within the pelvis. Hepatobiliary: Apparent mild fatty infiltration of the liver. Cholecystectomy. There is mild dilatation of the central CBD, likely post cholecystectomy. No retained calcified stone noted within the CBD. Small amount of fluid in the in the subhepatic area and within the cholecystectomy bed. Pancreas: Unremarkable. No pancreatic ductal dilatation or surrounding inflammatory changes. Spleen: Normal in size without focal abnormality. Adrenals/Urinary Tract: The adrenal glands are unremarkable. There are small nonobstructing bilateral renal calculi as seen on the prior CT. No hydronephrosis. Left renal hypodense lesion is too small to characterize, likely a cyst. The visualized ureters appear unremarkable. There is partial distention of the urinary bladder despite presence of a Foley catheter. Small amount of air is also seen within the urinary bladder, likely introduced via Foley. Stomach/Bowel: An enteric tube is seen with tip in the distal stomach. There is no evidence of bowel obstruction. There is mild inflammatory changes and thickening of the distal small bowel loops, likely reactive. There is postsurgical changes of appendectomy. There is a small amount of loculated fluid adjacent to the appendix to appendectomy clip and extending into the pelvis. There is a 5.7 x 3.0 x 2.8 cm loculated fluid with enhancing wall in the cul-de-sac most consistent with an infected fluid collection or developing abscess. There is  an 11 x 10 x 32 mm fluid collection noted in the lower abdomen in the midline (series 2, image 61). Vascular/Lymphatic: No significant vascular findings are present. No enlarged abdominal or pelvic lymph nodes. Reproductive: Uterus and bilateral adnexa are unremarkable. Other: There is mild diffuse subcutaneous edema. There is stranding of the  periumbilical subcutaneous fat with a small pocket of air, likely related to laparoscopic port. No fluid collection. Musculoskeletal: No acute or significant osseous findings. IMPRESSION: 1. Status post recent appendectomy with small loculated fluid adjacent to the appendectomy clip as well as a loculated fluid in the cul-de-sac most consistent with developing abscesses. Suture dehiscence and leakage from the appendectomy stump is not excluded. 2. Prior cholecystectomy. Small amount of fluid in the cholecystectomy bed, likely extension of the fluid from the pelvis. Body leak as the cause of the pelvic abscess is much less likely. 3. Thickened and inflamed loops of distal small bowel, likely reactive. No bowel obstruction. 4. Fatty liver. 5. Nonobstructing bilateral renal calculi. 6. Partially visualized bilateral pleural effusions and bilateral lower lobe partial compressive atelectasis. Pneumonia is not excluded. Clinical correlation is recommended. Electronically Signed   By: Elgie CollardArash  Radparvar M.D.   On: 08/04/2016 03:30   Ct Abdomen Pelvis W Contrast  Result Date: 07/29/2016 CLINICAL DATA:  Lower abdomen and pelvic pain for 3 days with nausea EXAM: CT ABDOMEN AND PELVIS WITH CONTRAST TECHNIQUE: Multidetector CT imaging of the abdomen and pelvis was performed using the standard protocol following bolus administration of intravenous contrast. CONTRAST:  100mL ISOVUE-300 IOPAMIDOL (ISOVUE-300) INJECTION 61% COMPARISON:  01/29/2010, CT 06/01/2009 FINDINGS: Lower chest: Lung bases demonstrate no acute consolidation or pleural effusion. Normal heart size. Hepatobiliary: No focal hepatic abnormality. Surgical clips in the gallbladder fossa. No biliary dilatation Pancreas: Unremarkable. No pancreatic ductal dilatation or surrounding inflammatory changes. Spleen: Normal in size without focal abnormality. Adrenals/Urinary Tract: Adrenal glands are within normal limits. Subcentimeter hypodensities in the kidneys too  small to further characterize. Multiple punctate stones in the right kidney. Multiple small stones in the left kidney, largest is seen in the upper pole and measures 3 mm. Bladder negative. Stomach/Bowel: Stomach within normal limits. No dilated small bowel. Significant right lower quadrant inflammation. Markedly dilated appendix, measuring up to 2 cm with hyperdensity in the proximal lumen measuring 2 cm, possibly a faintly calcified stone. No extraluminal gas collection. Vascular/Lymphatic: Non aneurysmal aorta. No significantly enlarged lymph nodes. Reproductive: Uterus and bilateral adnexa are unremarkable. 15 mm cyst left adnexa Other: No free air.  Small amount of free fluid in the pelvis Musculoskeletal: No acute or significant osseous findings. IMPRESSION: 1. Markedly enlarged appendix up to 2 cm with oval intraluminal density measuring up to 2 cm suspicious for an appendicolith. Significant surrounding inflammatory changes in the right lower quadrant and the findings are consistent with an acute appendicitis. No extraluminal gas collections at this time. No evidence for a pelvic abscess. 2. Multiple nonobstructing stones within the bilateral kidneys. Electronically Signed   By: Jasmine PangKim  Fujinaga M.D.   On: 07/29/2016 20:46   Dg Abd 2 Views  Result Date: 08/02/2016 CLINICAL DATA:  Nausea, vomiting this morning EXAM: ABDOMEN - 2 VIEW COMPARISON:  CT 07/29/2016 FINDINGS: Oral contrast material noted within the colon. Nonobstructive bowel gas pattern. No free air organomegaly. IMPRESSION: No acute findings. Electronically Signed   By: Charlett NoseKevin  Dover M.D.   On: 08/02/2016 14:40    Labs:  CBC:  Recent Labs  08/02/16 0530 08/03/16 0536 08/03/16 2305 08/04/16 0106  WBC 14.5* 11.6* 10.6* 10.5  HGB 11.2* 10.9* 10.6* 11.1*  HCT 33.6* 32.5* 31.3* 32.9*  PLT 309 297 270 269    COAGS:  Recent Labs  08/03/16 2157  INR 3.28    BMP:  Recent Labs  07/29/16 1930 08/02/16 0530 08/03/16 0536  08/03/16 2305 08/04/16 0106  NA 137 139 142  --  142  K 3.7 3.3* 3.5  --  3.2*  CL 103 110 111  --  111  CO2 19* 20* 21*  --  22  GLUCOSE 81 102* 147*  --  184*  BUN 10 12 16   --  11  CALCIUM 8.5* 8.1* 7.9*  --  7.6*  CREATININE 0.99 0.67 0.65 0.53 0.62  GFRNONAA >60 >60 >60 >60 >60  GFRAA >60 >60 >60 >60 >60    LIVER FUNCTION TESTS:  Recent Labs  07/29/16 1930 08/03/16 1626 08/04/16 0106  BILITOT 0.7 3.4* 2.7*  AST 13* 2,362* 1,186*  ALT 8* 4,495* 3,934*  ALKPHOS 84 356* 299*  PROT 7.2 5.2* 5.2*  ALBUMIN 3.9 3.0* 2.7*    TUMOR MARKERS: No results for input(s): AFPTM, CEA, CA199, CHROMGRNA in the last 8760 hours.  Assessment and Plan: 40 y.o. female with prior history of alcohol use/pancreatitis was recently admitted with abdominal pain, nausea, fever and imaging findings consistent with perforated appendicitis. She is status post laparoscopic appendectomy on 07/30/16. Postop course been complicated by elevated LFTs and altered mental status as well as elevated ammonia levels. Follow-up CT abdomen /pelvis done today reveals:  1. Status post recent appendectomy with small loculated fluid adjacent to the appendectomy clip as well as a loculated fluid in the cul-de-sac most consistent with developing abscesses. Suture dehiscence and leakage from the appendectomy stump is not excluded. 2. Prior cholecystectomy. Small amount of fluid in the cholecystectomy bed, likely extension of the fluid from the pelvis. Bile leak as the cause of the pelvic abscess is much less likely. 3. Thickened and inflamed loops of distal small bowel, likely reactive. No bowel obstruction. 4. Fatty liver. 5. Nonobstructing bilateral renal calculi. 6. Partially visualized bilateral pleural effusions and bilateral lower lobe partial compressive atelectasis. Pneumonia is not excluded. Clinical correlation is recommended  Request now received from surgery for CT-guided aspiration/possible drainage of  pelvic fluid collection. Patient is currently afebrile with WBC 10.5, hemoglobin 11.1, platelets 269, PT 34.1, INR 3.28, creatinine 0.62, potassium 3.2. Imaging studies have been reviewed by Dr. Loreta Ave and area is amenable to aspiration and possible drain placement. With normal WBC and temperature at this time would hold on aspiration/drain until INR is within normal range (1.6 or less preferably) to minimize assoc bleeding complications. If pt's clinical status worsens with fever/elevated WBC can then pursue asp/drainage. GI f/u pending.     Thank you for this interesting consult.  I greatly enjoyed meeting Valerie Mitchell and look forward to participating in their care.  A copy of this report was sent to the requesting provider on this date.  Electronically Signed: D. Jeananne Rama, PA-C 08/04/2016, 11:00 AM   I spent a total of 30 minutes   in face to face in clinical consultation, greater than 50% of which was counseling/coordinating care for CT-guided aspiration/possible drainage of pelvic fluid collection

## 2016-08-04 NOTE — Progress Notes (Signed)
We were called for consult in this patient regarding Elevated LFT's, Encephalopathy, increasing INR and Ammonia levels in a patient s/p appendectomy with perf, on 07/30/16. [facility] and the patient is still in good standing with them, though she has not been seen in some time. They have accepted this patient in consult. Please await Dr. Donavan BurnetBuccini's notes.  Thank you. Hyacinth MeekerJennifer Myisha Pickerel, PA-C [facility]

## 2016-08-04 NOTE — Progress Notes (Addendum)
PULMONARY / CRITICAL CARE MEDICINE   Name: Valerie AdlerStephanie R Hukill MRN: [id number] DOB: 22-Nov-1975    ADMISSION DATE:  07/29/2016 CONSULTATION DATE:  08/03/2016  REFERRING MD:  Magnus IvanBlackman  CHIEF COMPLAINT:  Confusion  BRIEF:   41 y/o female with a past history of pancreatitis and gastroparesis presented on 7/24 with belly pain due to perforated appendicitis requiring surgical management.  On post op day 5 she had progressive confusion and elevated LFT's requiring transfer to the ICU.    SUBJECTIVE:  Confusion persists  VITAL SIGNS: BP (!) 134/91 (BP Location: Left Arm)   Pulse 91   Temp 98.1 F (36.7 C) (Oral)   Resp (!) 23   Ht 5\' 7"  (1.702 m)   Wt 147 lb 4.3 oz (66.8 kg)   LMP 07/22/2016 Comment: negative urein pregnancy test 07/29/16  SpO2 97%   BMI 23.07 kg/m   HEMODYNAMICS:    VENTILATOR SETTINGS:    INTAKE / OUTPUT: I/O last 3 completed shifts: In: 4771 [P.O.:30; I.V.:3097.7; IV Piggyback:1643.3] Out: 3175 [Urine:3175]  PHYSICAL EXAMINATION: General: lying in bed, mild pain Neuro:  Awake, oriented only to hospital and name but not to time, date or situation, moves all four ext HEENT:  NCAT OP clear Cardiovascular:  RRR, no mgr Lungs:  CTA B Abdomen:  Bowel sounds positive, surgical scars well healed Musculoskeletal:  Normal bulk and tone Skin:  No rash or skin breakdown, surgical wounds well healed  LABS:  BMET  Recent Labs Lab 08/02/16 0530 08/03/16 0536 08/03/16 2305 08/04/16 0106  NA 139 142  --  142  K 3.3* 3.5  --  3.2*  CL 110 111  --  111  CO2 20* 21*  --  22  BUN 12 16  --  11  CREATININE 0.67 0.65 0.53 0.62  GLUCOSE 102* 147*  --  184*    Electrolytes  Recent Labs Lab 08/02/16 0530 08/03/16 0536 08/04/16 0106  CALCIUM 8.1* 7.9* 7.6*  MG  --  2.0 1.8  PHOS  --   --  <1.0*    CBC  Recent Labs Lab 08/03/16 0536 08/03/16 2305 08/04/16 0106  WBC 11.6* 10.6* 10.5  HGB 10.9* 10.6* 11.1*  HCT 32.5* 31.3* 32.9*  PLT 297 270  269    Coag's  Recent Labs Lab 08/03/16 2157  INR 3.28    Sepsis Markers  Recent Labs Lab 08/03/16 2156 08/04/16 0106 08/04/16 0515  LATICACIDVEN 1.9 2.3* 1.7    ABG  Recent Labs Lab 08/03/16 2025  PHART 7.439  PCO2ART 34.2  PO2ART 73.0*    Liver Enzymes  Recent Labs Lab 07/29/16 1930 08/03/16 1626 08/04/16 0106  AST 13* 2,362* 1,186*  ALT 8* 4,495* 3,934*  ALKPHOS 84 356* 299*  BILITOT 0.7 3.4* 2.7*  ALBUMIN 3.9 3.0* 2.7*    Cardiac Enzymes No results for input(s): TROPONINI, PROBNP in the last 168 hours.  Glucose  Recent Labs Lab 08/03/16 1117 08/03/16 2027  GLUCAP 142* 159*    Imaging Dg Abd 1 View  Result Date: 08/03/2016 CLINICAL DATA:  NG tube placement EXAM: ABDOMEN - 1 VIEW COMPARISON:  08/02/2016 FINDINGS: Enteric tube terminates in the distal gastric body. Cholecystectomy clips. Contrast in the right colon. IMPRESSION: Enteric tube terminates in the distal gastric body. Electronically Signed   By: Charline BillsSriyesh  Krishnan M.D.   On: 08/03/2016 22:40   Ct Head Wo Contrast  Result Date: 08/03/2016 CLINICAL DATA:  Altered mental status. EXAM: CT HEAD WITHOUT CONTRAST TECHNIQUE: Contiguous axial images were  obtained from the base of the skull through the vertex without intravenous contrast. COMPARISON:  03/19/2010 FINDINGS: Brain: There is no evidence of acute infarct, intracranial hemorrhage, mass, midline shift, or extra-axial fluid collection. The ventricles and sulci are normal. Vascular: No hyperdense vessel. Skull: No fracture focal osseous lesion. Sinuses/Orbits: No significant inflammatory disease in the included paranasal sinuses are mastoid air cells. Unremarkable orbits. Other: None. IMPRESSION: Unremarkable head CT. Electronically Signed   By: Sebastian Ache M.D.   On: 08/03/2016 12:40   Ct Abdomen Pelvis W Contrast  Result Date: 08/04/2016 CLINICAL DATA:  41 year old female with recent appendectomy with abdominal pain. EXAM: CT ABDOMEN AND  PELVIS WITH CONTRAST TECHNIQUE: Multidetector CT imaging of the abdomen and pelvis was performed using the standard protocol following bolus administration of intravenous contrast. CONTRAST:  ISOVUE-300 IOPAMIDOL (ISOVUE-300) INJECTION 61% COMPARISON:  Abdominal CT dated 07/29/2016 FINDINGS: Lower chest: Partially visualized small bilateral pleural effusions with associated partial compressive atelectasis of the lower lobes. Pneumonia is not excluded. Clinical correlation is recommended. No intra-abdominal free air.  Small free fluid within the pelvis. Hepatobiliary: Apparent mild fatty infiltration of the liver. Cholecystectomy. There is mild dilatation of the central CBD, likely post cholecystectomy. No retained calcified stone noted within the CBD. Small amount of fluid in the in the subhepatic area and within the cholecystectomy bed. Pancreas: Unremarkable. No pancreatic ductal dilatation or surrounding inflammatory changes. Spleen: Normal in size without focal abnormality. Adrenals/Urinary Tract: The adrenal glands are unremarkable. There are small nonobstructing bilateral renal calculi as seen on the prior CT. No hydronephrosis. Left renal hypodense lesion is too small to characterize, likely a cyst. The visualized ureters appear unremarkable. There is partial distention of the urinary bladder despite presence of a Foley catheter. Small amount of air is also seen within the urinary bladder, likely introduced via Foley. Stomach/Bowel: An enteric tube is seen with tip in the distal stomach. There is no evidence of bowel obstruction. There is mild inflammatory changes and thickening of the distal small bowel loops, likely reactive. There is postsurgical changes of appendectomy. There is a small amount of loculated fluid adjacent to the appendix to appendectomy clip and extending into the pelvis. There is a 5.7 x 3.0 x 2.8 cm loculated fluid with enhancing wall in the cul-de-sac most consistent with an  infected fluid collection or developing abscess. There is an 11 x 10 x 32 mm fluid collection noted in the lower abdomen in the midline (series 2, image 61). Vascular/Lymphatic: No significant vascular findings are present. No enlarged abdominal or pelvic lymph nodes. Reproductive: Uterus and bilateral adnexa are unremarkable. Other: There is mild diffuse subcutaneous edema. There is stranding of the periumbilical subcutaneous fat with a small pocket of air, likely related to laparoscopic port. No fluid collection. Musculoskeletal: No acute or significant osseous findings. IMPRESSION: 1. Status post recent appendectomy with small loculated fluid adjacent to the appendectomy clip as well as a loculated fluid in the cul-de-sac most consistent with developing abscesses. Suture dehiscence and leakage from the appendectomy stump is not excluded. 2. Prior cholecystectomy. Small amount of fluid in the cholecystectomy bed, likely extension of the fluid from the pelvis. Body leak as the cause of the pelvic abscess is much less likely. 3. Thickened and inflamed loops of distal small bowel, likely reactive. No bowel obstruction. 4. Fatty liver. 5. Nonobstructing bilateral renal calculi. 6. Partially visualized bilateral pleural effusions and bilateral lower lobe partial compressive atelectasis. Pneumonia is not excluded. Clinical correlation is recommended. Electronically  Signed   By: Elgie CollardArash  Radparvar M.D.   On: 08/04/2016 03:30     STUDIES:  7/24 CT ab/pelv> enlarged appendix with significant inflammatory changes around with no extraluminal gas, non-obstructive kidney stones 7/29 CT head > normal head CT 7/30 CT ab/pelv> s/p recent appy with small loculated fluid adjacent to the appy clip, likely abscess, prior chole, thickenined inflamed loops of small bowel, fatty liver, non-obstructive renal calculi, parcially visulalized bilateral pleural effusions  CULTURES: 7/29 blood >  7/29 urine >  7/24 urine > multiple  species   ANTIBIOTICS: 7/24 ceftriaxone/flagyl > 7/26 7/24 zosyn x1 7/29 zosyn >  7/29 vanc >   SIGNIFICANT EVENTS: 7/25 lap appy> perforated appendicitis, resected, no abscess  LINES/TUBES:   DISCUSSION: 41 y/o female with a past history 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  She also has evidence of a peritoneal abscess seen on 7/30 CT abdom/pelvis.  DDx of liver injury includes acetaminophen induced injury (family reports she takes at least 2,000gm daily chronically and took more than that just prior to admission) vs less likely viral vs other pharmacologic cause.  Given confusion, elevated ammonia and elevated INR prognosis is guarded.  ASSESSMENT / PLAN:  GASTROINTESTINAL A:   Appendicitis s/p appy Peritoneal abscess Acute liver failure of uncertain etiology Baseline history of pancreatitis, gastroparesis; history of unexplained liver injury 2012 Fatty liver S/p cholecystecomy P:   GI consult now Hold APAP Check acute viral hepatitis panel Minimize sedating meds Nutrition per surgery IR drainage of peritoneal abscess per surgery Continue mucomyst for now  PULMONARY A: No acute issues P:   Monitor respiratory status  CARDIOVASCULAR A:  No acute issues P:  Tele Monitor hemodynamics  RENAL A:   Hypophosphatemia Hypokalemia P:   Replace K, Phos  HEMATOLOGIC A:   Anemia without bleeding P:  Monitor for bleeding  INFECTIOUS A:   Peritoneal abscess post appy P:   F/u cultures Continue vanc/zosyn  ENDOCRINE A:   No acute issues   P:   Monitor glucose  NEUROLOGIC A:   Acute encephalopathy P:   Minimize sedating medications   FAMILY  - Updates: mother/partner updated bedside 7/30  Discussed with gastroenterology and GI surgery  My cc time 35 minutes  Heber CarolinaBrent McQuaid, MD [facility] Pager: [phone] Cell:  [phone] After 3pm or if no response, call [phone]    08/04/2016, 10:07 AM

## 2016-08-04 NOTE — Progress Notes (Signed)
Mentation much clearer this afternoon. Able to communicate needs/ask questions at this time. Asked if she could take the green mittens off at this time, promised she would not pull the ng tube because she doesn't want to go through putting it back down. This RN removed the mittens and she said she would like to sleep in them to protect her tube tonight. Once removed she insisted 1 go back on for tube protection. She chose the right hand, and reached and held the ng with the left hand. This RN reminded patient to not pull ng tube and she said "is this the tube" as she was holding it. She released it without issues once this RN said yes. Talking with mom in room sensibly.

## 2016-08-04 NOTE — Progress Notes (Signed)
CRITICAL VALUE ALERT  Critical Value: Lactic Acid 2.3  Date & Time Notied:  08/04/2016 0155  Provider Notified: Marchelle Gearingamaswamy  Orders Received/Actions taken: Orders received

## 2016-08-04 NOTE — Progress Notes (Signed)
Patient ID: Valerie AdlerStephanie R Jasper, female   DOB: Sep 19, 1975, 41 y.o.   MRN: [id number]  [facility]  5 Days Post-Op  Subjective: CC- perforated appendicitis Mother at bedside. Patient transferred to ICU over the weekend with acute liver injury for unclear etiology. She is disoriented this morning and complaining of abdominal pain. Vomited last night and NG tube was placed.  Objective: Vital signs in last 24 hours: Temp:  [97.7 F (36.5 C)-98.9 F (37.2 C)] 98.6 F (37 C) (07/30 0400) Pulse Rate:  [91-101] 91 (07/30 0600) Resp:  [14-31] 15 (07/30 0600) BP: (118-159)/(64-94) 140/79 (07/30 0600) SpO2:  [94 %-98 %] 95 % (07/30 0600) Weight:  [147 lb 4.3 oz (66.8 kg)] 147 lb 4.3 oz (66.8 kg) (07/30 0320) Last BM Date: 08/01/16  Intake/Output from previous day: 07/29 0701 - 07/30 0700 In: 3749.8 [I.V.:2106.4; IV Piggyback:1643.3] Out: 3175 [Urine:3175] Intake/Output this shift: No intake/output data recorded.  PE: Gen: Alert, NAD but appears very uncomfortable HEENT: EOM's intact, pupils equal and round Card: RRR, no M/G/R heard Pulm: CTAB, no W/R/R, effort normal Abd: Soft, mild distension, globally tender but most significant in the right quadrants/no rebound or guarding, hypoactive BS, multiple lapincisions C/D/I Ext: No erythema, edema, or tenderness BUE/BLE  Psych: oriented to self but not time or place, agitated Skin: no rashes noted, warm and dry  Lab Results:   Recent Labs  08/03/16 2305 08/04/16 0106  WBC 10.6* 10.5  HGB 10.6* 11.1*  HCT 31.3* 32.9*  PLT 270 269   BMET  Recent Labs  08/03/16 0536 08/03/16 2305 08/04/16 0106  NA 142  --  142  K 3.5  --  3.2*  CL 111  --  111  CO2 21*  --  22  GLUCOSE 147*  --  184*  BUN 16  --  11  CREATININE 0.65 0.53 0.62  CALCIUM 7.9*  --  7.6*   PT/INR  Recent Labs  08/03/16 2157  LABPROT 34.1*  INR 3.28   CMP     Component Value Date/Time   NA 142 08/04/2016 0106   K  3.2 (L) 08/04/2016 0106   CL 111 08/04/2016 0106   CO2 22 08/04/2016 0106   GLUCOSE 184 (H) 08/04/2016 0106   BUN 11 08/04/2016 0106   CREATININE 0.62 08/04/2016 0106   CREATININE 1.05 03/27/2010 1650   CALCIUM 7.6 (L) 08/04/2016 0106   PROT 5.2 (L) 08/04/2016 0106   ALBUMIN 2.7 (L) 08/04/2016 0106   ALBUMIN 4.3 03/20/2010   AST 1,186 (H) 08/04/2016 0106   AST 12 03/20/2010   ALT 3,934 (H) 08/04/2016 0106   ALKPHOS 299 (H) 08/04/2016 0106   ALKPHOS 95 03/20/2010   BILITOT 2.7 (H) 08/04/2016 0106   BILITOT 0.3 03/20/2010   GFRNONAA >60 08/04/2016 0106   GFRAA >60 08/04/2016 0106   Lipase     Component Value Date/Time   LIPASE 33 08/03/2016 1626       Studies/Results: Dg Abd 1 View  Result Date: 08/03/2016 CLINICAL DATA:  NG tube placement EXAM: ABDOMEN - 1 VIEW COMPARISON:  08/02/2016 FINDINGS: Enteric tube terminates in the distal gastric body. Cholecystectomy clips. Contrast in the right colon. IMPRESSION: Enteric tube terminates in the distal gastric body. Electronically Signed   By: Charline BillsSriyesh  Krishnan M.D.   On: 08/03/2016 22:40   Ct Head Wo Contrast  Result Date: 08/03/2016 CLINICAL DATA:  Altered mental status. EXAM: CT HEAD WITHOUT CONTRAST TECHNIQUE: Contiguous axial images were obtained from the base  of the skull through the vertex without intravenous contrast. COMPARISON:  03/19/2010 FINDINGS: Brain: There is no evidence of acute infarct, intracranial hemorrhage, mass, midline shift, or extra-axial fluid collection. The ventricles and sulci are normal. Vascular: No hyperdense vessel. Skull: No fracture focal osseous lesion. Sinuses/Orbits: No significant inflammatory disease in the included paranasal sinuses are mastoid air cells. Unremarkable orbits. Other: None. IMPRESSION: Unremarkable head CT. Electronically Signed   By: Sebastian Ache M.D.   On: 08/03/2016 12:40   Ct Abdomen Pelvis W Contrast  Result Date: 08/04/2016 CLINICAL DATA:  41 year old female with recent  appendectomy with abdominal pain. EXAM: CT ABDOMEN AND PELVIS WITH CONTRAST TECHNIQUE: Multidetector CT imaging of the abdomen and pelvis was performed using the standard protocol following bolus administration of intravenous contrast. CONTRAST:  ISOVUE-300 IOPAMIDOL (ISOVUE-300) INJECTION 61% COMPARISON:  Abdominal CT dated 07/29/2016 FINDINGS: Lower chest: Partially visualized small bilateral pleural effusions with associated partial compressive atelectasis of the lower lobes. Pneumonia is not excluded. Clinical correlation is recommended. No intra-abdominal free air.  Small free fluid within the pelvis. Hepatobiliary: Apparent mild fatty infiltration of the liver. Cholecystectomy. There is mild dilatation of the central CBD, likely post cholecystectomy. No retained calcified stone noted within the CBD. Small amount of fluid in the in the subhepatic area and within the cholecystectomy bed. Pancreas: Unremarkable. No pancreatic ductal dilatation or surrounding inflammatory changes. Spleen: Normal in size without focal abnormality. Adrenals/Urinary Tract: The adrenal glands are unremarkable. There are small nonobstructing bilateral renal calculi as seen on the prior CT. No hydronephrosis. Left renal hypodense lesion is too small to characterize, likely a cyst. The visualized ureters appear unremarkable. There is partial distention of the urinary bladder despite presence of a Foley catheter. Small amount of air is also seen within the urinary bladder, likely introduced via Foley. Stomach/Bowel: An enteric tube is seen with tip in the distal stomach. There is no evidence of bowel obstruction. There is mild inflammatory changes and thickening of the distal small bowel loops, likely reactive. There is postsurgical changes of appendectomy. There is a small amount of loculated fluid adjacent to the appendix to appendectomy clip and extending into the pelvis. There is a 5.7 x 3.0 x 2.8 cm loculated fluid with  enhancing wall in the cul-de-sac most consistent with an infected fluid collection or developing abscess. There is an 11 x 10 x 32 mm fluid collection noted in the lower abdomen in the midline (series 2, image 61). Vascular/Lymphatic: No significant vascular findings are present. No enlarged abdominal or pelvic lymph nodes. Reproductive: Uterus and bilateral adnexa are unremarkable. Other: There is mild diffuse subcutaneous edema. There is stranding of the periumbilical subcutaneous fat with a small pocket of air, likely related to laparoscopic port. No fluid collection. Musculoskeletal: No acute or significant osseous findings. IMPRESSION: 1. Status post recent appendectomy with small loculated fluid adjacent to the appendectomy clip as well as a loculated fluid in the cul-de-sac most consistent with developing abscesses. Suture dehiscence and leakage from the appendectomy stump is not excluded. 2. Prior cholecystectomy. Small amount of fluid in the cholecystectomy bed, likely extension of the fluid from the pelvis. Body leak as the cause of the pelvic abscess is much less likely. 3. Thickened and inflamed loops of distal small bowel, likely reactive. No bowel obstruction. 4. Fatty liver. 5. Nonobstructing bilateral renal calculi. 6. Partially visualized bilateral pleural effusions and bilateral lower lobe partial compressive atelectasis. Pneumonia is not excluded. Clinical correlation is recommended. Electronically Signed   By:  Elgie CollardArash  Radparvar M.D.   On: 08/04/2016 03:30   Dg Abd 2 Views  Result Date: 08/02/2016 CLINICAL DATA:  Nausea, vomiting this morning EXAM: ABDOMEN - 2 VIEW COMPARISON:  CT 07/29/2016 FINDINGS: Oral contrast material noted within the colon. Nonobstructive bowel gas pattern. No free air organomegaly. IMPRESSION: No acute findings. Electronically Signed   By: Charlett NoseKevin  Dover M.D.   On: 08/02/2016 14:40    Anti-infectives: Anti-infectives    Start     Dose/Rate Route Frequency Ordered  Stop   08/04/16 1000  vancomycin (VANCOCIN) IVPB 750 mg/150 ml premix     750 mg 150 mL/hr over 60 Minutes Intravenous Every 8 hours 08/04/16 0033     08/03/16 2300  piperacillin-tazobactam (ZOSYN) IVPB 3.375 g  Status:  Discontinued     3.375 g 12.5 mL/hr over 240 Minutes Intravenous  Once 08/03/16 2247 08/03/16 2249   08/03/16 2300  vancomycin (VANCOCIN) IVPB 1000 mg/200 mL premix     1,000 mg 200 mL/hr over 60 Minutes Intravenous  Once 08/03/16 2247 08/04/16 0311   08/03/16 1400  piperacillin-tazobactam (ZOSYN) IVPB 3.375 g     3.375 g 12.5 mL/hr over 240 Minutes Intravenous Every 8 hours 08/03/16 1148     08/01/16 1200  amoxicillin-clavulanate (AUGMENTIN) 875-125 MG per tablet 1 tablet  Status:  Discontinued     1 tablet Oral Every 12 hours 08/01/16 1004 08/03/16 1143   07/31/16 0000  amoxicillin-clavulanate (AUGMENTIN) 875-125 MG tablet     1 tablet Oral 2 times daily 07/31/16 0851     07/30/16 0200  metroNIDAZOLE (FLAGYL) IVPB 500 mg  Status:  Discontinued     500 mg 100 mL/hr over 60 Minutes Intravenous Every 8 hours 07/30/16 0003 08/01/16 1004   07/30/16 0100  cefTRIAXone (ROCEPHIN) 2 g in dextrose 5 % 50 mL IVPB  Status:  Discontinued     2 g 100 mL/hr over 30 Minutes Intravenous Every 24 hours 07/30/16 0003 08/01/16 1004   07/29/16 2115  piperacillin-tazobactam (ZOSYN) IVPB 3.375 g     3.375 g 100 mL/hr over 30 Minutes Intravenous  Once 07/29/16 2107 07/29/16 2201       Assessment/Plan Acute liver injury for unclear etiology - transferred to the ICU over the weekend with acute worsening of status, appreciate CCM assistance.  Perforated appendicitis S/p lap appy 7/25 Dr. Magnus IvanBlackman - POD 5 - CT scan from today shows small loculated fluid adjacent to the appendectomy clip as well as a loculated fluid in the cul-de-sac most consistent with developing abscesses, suture dehiscence and leakage from the appendectomy stump is not excluded  ID - rocephin/flagyl 7/25>>7/27,  augmentin 7/27>>7/29, zosyn 7/29>>day#2, vancomycin 7/30>>day#1 FEN - IVF, NPO/NGT VTE - SCDs  Plan - Consulting IR for possible aspiration/drainage of pelvic fluid collections. Continue broad spectrum antibiotics and NPO.   LOS: 6 days    Franne FortsBROOKE A Luisangel Wainright , [facility] 08/04/2016, 8:05 AM Pager: [phone] Consults: [phone] Mon-Fri 7:00 am-4:30 pm Sat-Sun 7:00 am-11:30 am

## 2016-08-04 NOTE — Progress Notes (Signed)
eLink Physician Progress Note and Electrolyte Replacement  Patient Name: Valerie AdlerStephanie R Mitchell DOB: [birth year] MRN: [id number]  Date of Service  08/04/2016   HPI/Events of Note    Recent Labs Lab 07/29/16 1930 08/02/16 0530 08/03/16 0536 08/04/16 0106  NA 137 139 142 142  K 3.7 3.3* 3.5 3.2*  CL 103 110 111 111  CO2 19* 20* 21* 22  GLUCOSE 81 102* 147* 184*  BUN 10 12 16 11   CREATININE 0.99 0.67 0.65 0.62  CALCIUM 8.5* 8.1* 7.9* 7.6*  MG  --   --  2.0 1.8  PHOS  --   --   --  <1.0*    Recent Labs Lab 07/29/16 1930 08/03/16 1626 08/03/16 2157 08/04/16 0106  AST 13* 2,362*  --  1,186*  ALT 8* 23  --  PENDING  ALKPHOS 84 356*  --  299*  BILITOT 0.7 3.4*  --  2.7*  PROT 7.2 5.2*  --  5.2*  ALBUMIN 3.9 3.0*  --  2.7*  INR  --   --  3.28  --      Recent Labs Lab 07/29/16 1947 08/03/16 2156 08/04/16 0106  LATICACIDVEN 1.82 1.9 2.3*    Estimated Creatinine Clearance: 90 mL/min (by C-G formula based on SCr of 0.62 mg/dL).  Intake/Output      07/29 0701 - 07/30 0700   P.O. 0   I.V. (mL/kg) 1125 (16.8)   IV Piggyback 100   Total Intake(mL/kg) 1225 (18.3)   Net +1225       Urine Occurrence 2 x   Emesis Occurrence 1 x    - I/O DETAILED x 24h    Total I/O In: 460 [I.V.:360; IV Piggyback:100] Out: -  - I/O THIS SHIFT    ASSESSMENT Rising lactate Hypokalemia Hypomagnesemia Hypophosphatemia   eICURN Interventions  Fluid bolus Replete Phos and mag Ongoing k repletioin   ASSESSMENT: MAJOR ELECTROLYTE      Dr. Kalman ShanMurali Kindrick Lankford, M.D., F.C.C.P Pulmonary and Critical Care Medicine Staff Physician [facility] Pager: [phone], If no answer or between  15:00h - 7:00h: call [phone]  08/04/2016 1:59 AM

## 2016-08-04 NOTE — Progress Notes (Signed)
eLink Physician-Brief Progress Note Patient Name: Valerie AdlerStephanie R Mitchell DOB: Mar 24, 1975 MRN: [id number]   Date of Service  08/04/2016  HPI/Events of Note  CT Abd shows cul de sac abscess ? Leak from appendectomy  eICU Interventions  Notify gen surgery     Intervention Category Intermediate Interventions: Diagnostic test evaluation  Valerie Mitchell 08/04/2016, 7:26 AM

## 2016-08-04 NOTE — Progress Notes (Signed)
Date:  August 04, 2016 Chart reviewed for concurrent status and case management needs. Will continue to follow patient progress./transferred to icu due to liver  Injury, npo with ng/tube in palce Discharge Planning: following for needs Expected discharge date: [id number] Marcelle SmilingRhonda Lashawn Orrego, BSN, [place]RN3, [place]M   [phone]

## 2016-08-04 NOTE — Progress Notes (Signed)
eLink Physician-Brief Progress Note Patient Name: Valerie AdlerStephanie R Mitchell DOB: [birth year] MRN: [id number]   Date of Service  08/04/2016  HPI/Events of Note   Recent Labs Lab 08/03/16 2157  INR 3.28     eICU Interventions  Dc lovenox Place scd     Intervention Category Major Interventions: Other:  Donya Tomaro 08/04/2016, 2:06 AM

## 2016-08-05 DIAGNOSIS — K7201 Acute and subacute hepatic failure with coma: Secondary | ICD-10-CM

## 2016-08-05 LAB — HEPATIC FUNCTION PANEL
ALBUMIN: 3 g/dL — AB (ref 3.5–5.0)
ALK PHOS: 356 U/L — AB (ref 38–126)
ALT: 4495 U/L — AB (ref 14–54)
ALT: 1998 U/L — ABNORMAL HIGH (ref 14–54)
AST: 2362 U/L — ABNORMAL HIGH (ref 15–41)
AST: 348 U/L — AB (ref 15–41)
Albumin: 2.5 g/dL — ABNORMAL LOW (ref 3.5–5.0)
Alkaline Phosphatase: 284 U/L — ABNORMAL HIGH (ref 38–126)
BILIRUBIN INDIRECT: 0.9 mg/dL (ref 0.3–0.9)
Bilirubin, Direct: 2.5 mg/dL — ABNORMAL HIGH (ref 0.1–0.5)
Bilirubin, Direct: 1.3 mg/dL — ABNORMAL HIGH (ref 0.1–0.5)
Indirect Bilirubin: 1.2 mg/dL — ABNORMAL HIGH (ref 0.3–0.9)
TOTAL PROTEIN: 5.2 g/dL — AB (ref 6.5–8.1)
Total Bilirubin: 3.4 mg/dL — ABNORMAL HIGH (ref 0.3–1.2)
Total Bilirubin: 2.5 mg/dL — ABNORMAL HIGH (ref 0.3–1.2)
Total Protein: 4.9 g/dL — ABNORMAL LOW (ref 6.5–8.1)

## 2016-08-05 LAB — CBC
HCT: 30.6 % — ABNORMAL LOW (ref 36.0–46.0)
Hemoglobin: 10.6 g/dL — ABNORMAL LOW (ref 12.0–15.0)
MCH: 31.1 pg (ref 26.0–34.0)
MCHC: 34.6 g/dL (ref 30.0–36.0)
MCV: 89.7 fL (ref 78.0–100.0)
Platelets: 247 10*3/uL (ref 150–400)
RBC: 3.41 MIL/uL — ABNORMAL LOW (ref 3.87–5.11)
RDW: 14.8 % (ref 11.5–15.5)
WBC: 6.8 10*3/uL (ref 4.0–10.5)

## 2016-08-05 LAB — PHOSPHORUS: Phosphorus: 1.5 mg/dL — ABNORMAL LOW (ref 2.5–4.6)

## 2016-08-05 LAB — URINE CULTURE: Culture: NO GROWTH

## 2016-08-05 LAB — BASIC METABOLIC PANEL
Anion gap: 9 (ref 5–15)
BUN: <5 mg/dL — ABNORMAL LOW (ref 6–20)
CALCIUM: 7.2 mg/dL — AB (ref 8.9–10.3)
CO2: 26 mmol/L (ref 22–32)
CREATININE: 0.42 mg/dL — AB (ref 0.44–1.00)
Chloride: 108 mmol/L (ref 101–111)
GFR calc Af Amer: >60 mL/min (ref >60)
GFR calc non Af Amer: >60 mL/min (ref >60)
GLUCOSE: 182 mg/dL — AB (ref 65–99)
Potassium: 2.5 mmol/L — CL (ref 3.5–5.1)
Sodium: 143 mmol/L (ref 135–145)

## 2016-08-05 LAB — MAGNESIUM
MAGNESIUM: 1.6 mg/dL — AB (ref 1.7–2.4)
Magnesium: 1.6 mg/dL — ABNORMAL LOW (ref 1.7–2.4)

## 2016-08-05 LAB — PROTIME-INR
INR: 2.51
PROTHROMBIN TIME: 27.6 s — AB (ref 11.4–15.2)

## 2016-08-05 LAB — AMMONIA: Ammonia: 118 umol/L — ABNORMAL HIGH (ref 9–35)

## 2016-08-05 LAB — ACETAMINOPHEN LEVEL: Acetaminophen (Tylenol), Serum: <10 ug/mL — ABNORMAL LOW (ref 10–30)

## 2016-08-05 LAB — HEPATITIS PANEL, ACUTE
HEP A IGM: NEGATIVE
Hep B C IgM: NEGATIVE
Hepatitis B Surface Ag: NEGATIVE

## 2016-08-05 MED ORDER — POTASSIUM CHLORIDE 10 MEQ/100ML IV SOLN
10.0000 meq | INTRAVENOUS | Status: AC
  Administered 2016-08-05 (×4): 10 meq via INTRAVENOUS
  Filled 2016-08-05 (×3): qty 100

## 2016-08-05 MED ORDER — PHENOL 1.4 % MT LIQD
1.0000 | OROMUCOSAL | Status: DC | PRN
  Administered 2016-08-05: 1 via OROMUCOSAL
  Filled 2016-08-05: qty 177

## 2016-08-05 MED ORDER — POTASSIUM PHOSPHATES 15 MMOLE/5ML IV SOLN
30.0000 mmol | Freq: Once | INTRAVENOUS | Status: AC
  Administered 2016-08-05: 30 mmol via INTRAVENOUS
  Filled 2016-08-05: qty 10

## 2016-08-05 MED ORDER — DEXTROSE-NACL 5-0.45 % IV SOLN
INTRAVENOUS | Status: DC
  Administered 2016-08-05 – 2016-08-06 (×2): via INTRAVENOUS

## 2016-08-05 MED ORDER — MAGNESIUM SULFATE 4 GM/100ML IV SOLN
4.0000 g | Freq: Once | INTRAVENOUS | Status: AC
  Administered 2016-08-05: 4 g via INTRAVENOUS
  Filled 2016-08-05: qty 100

## 2016-08-05 NOTE — Progress Notes (Signed)
6 Days Post-Op    CC:  abdominal pain   Subjective: Sleepy but wakes up.  Mother is with her this AM.  Abdomen is still sore.  Over all she appears stable and improving from the chart review.    Objective: Vital signs in last 24 hours: Temp:  [97.4 F (36.3 C)-98.7 F (37.1 C)] 97.4 F (36.3 C) (07/31 0337) Pulse Rate:  [71-95] 81 (07/31 0800) Resp:  [11-32] 14 (07/31 0800) BP: (88-140)/(55-102) 134/73 (07/31 0800) SpO2:  [88 %-100 %] 100 % (07/31 0800) Weight:  [69.6 kg (153 lb 7 oz)] 69.6 kg (153 lb 7 oz) (07/31 0337) Last BM Date: 08/04/16 3533 IV 3800 urine 1100 NG BM x 2 Afebrile, VSS  BP was down, but better this AM K+ 2.5 GLUCOSE 182 CREATININE 0.42 LFT's improving as noted below WBC 6.8  H/H stable, platelets OK INR improving down to 2.51 Hepatitis panel is negative for A, B, and C Ammonia is better down to 118 from 131 CT 7/30: small loculated fluid adjacent to the appendectomy clip as well as a loculated fluid in the cul-de-sac most consistent with developing abscesses Intake/Output from previous day: 07/30 0701 - 07/31 0700 In: 3533.3 [I.V.:2258.3; NG/GT:100; IV Piggyback:750] Out: 4900 [Urine:3800; Emesis/NG output:1100] Intake/Output this shift: Total I/O In: 140.1 [I.V.:25.1; NG/GT:30; IV Piggyback:85] Out: -   General appearance: alert, cooperative and no distress Resp: clear to auscultation bilaterally GI: soft, sore, BS are hypoactive, NG working, 2 BM yesterday.  Lab Results:   Recent Labs  08/04/16 0106 08/05/16 0339  WBC 10.5 6.8  HGB 11.1* 10.6*  HCT 32.9* 30.6*  PLT 269 247    BMET  Recent Labs  08/04/16 0106 08/05/16 0339  NA 142 143  K 3.2* 2.5*  CL 111 108  CO2 22 26  GLUCOSE 184* 182*  BUN 11 <5*  CREATININE 0.62 0.42*  CALCIUM 7.6* 7.2*   PT/INR  Recent Labs  08/04/16 1058 08/05/16 0339  LABPROT 30.4* 27.6*  INR 2.84 2.51     Recent Labs Lab 07/29/16 1930 08/03/16 1626 08/04/16 0106 08/05/16 0339  AST  13* 2,362* 1,186* 348*  ALT 8* 4,495* 3,934* 1,998*  ALKPHOS 84 356* 299* 284*  BILITOT 0.7 3.4* 2.7* 2.5*  PROT 7.2 5.2* 5.2* 4.9*  ALBUMIN 3.9 3.0* 2.7* 2.5*     Lipase     Component Value Date/Time   LIPASE 33 08/03/2016 1626     Medications: . mupirocin ointment  1 application Nasal BID   . acetylcysteine 15 mg/kg/hr (08/04/16 1520)  . dextrose 5 % and 0.45% NaCl 75 mL/hr at 08/05/16 0521  . piperacillin-tazobactam (ZOSYN)  IV 3.375 g (08/05/16 0521)  . potassium chloride    . potassium phosphate IVPB (mmol) 30 mmol (08/05/16 0748)  . vancomycin Stopped (08/05/16 0315)   Anti-infectives    Start     Dose/Rate Route Frequency Ordered Stop   08/04/16 1000  vancomycin (VANCOCIN) IVPB 750 mg/150 ml premix     750 mg 150 mL/hr over 60 Minutes Intravenous Every 8 hours 08/04/16 0033     08/03/16 2300  piperacillin-tazobactam (ZOSYN) IVPB 3.375 g  Status:  Discontinued     3.375 g 12.5 mL/hr over 240 Minutes Intravenous  Once 08/03/16 2247 08/03/16 2249   08/03/16 2300  vancomycin (VANCOCIN) IVPB 1000 mg/200 mL premix     1,000 mg 200 mL/hr over 60 Minutes Intravenous  Once 08/03/16 2247 08/04/16 0311   08/03/16 1400  piperacillin-tazobactam (ZOSYN) IVPB 3.375 g  3.375 g 12.5 mL/hr over 240 Minutes Intravenous Every 8 hours 08/03/16 1148     08/01/16 1200  amoxicillin-clavulanate (AUGMENTIN) 875-125 MG per tablet 1 tablet  Status:  Discontinued     1 tablet Oral Every 12 hours 08/01/16 1004 08/03/16 1143   07/31/16 0000  amoxicillin-clavulanate (AUGMENTIN) 875-125 MG tablet     1 tablet Oral 2 times daily 07/31/16 0851     07/30/16 0200  metroNIDAZOLE (FLAGYL) IVPB 500 mg  Status:  Discontinued     500 mg 100 mL/hr over 60 Minutes Intravenous Every 8 hours 07/30/16 0003 08/01/16 1004   07/30/16 0100  cefTRIAXone (ROCEPHIN) 2 g in dextrose 5 % 50 mL IVPB  Status:  Discontinued     2 g 100 mL/hr over 30 Minutes Intravenous Every 24 hours 07/30/16 0003 08/01/16 1004    07/29/16 2115  piperacillin-tazobactam (ZOSYN) IVPB 3.375 g     3.375 g 100 mL/hr over 30 Minutes Intravenous  Once 07/29/16 2107 07/29/16 2201     Assessment/Plan Acute appendicitis with perforation S/p laparoscopic appendectomy 07/30/16, Dr. Darnell Levelodd Gerkin Post op leak with abscess Acute liver toxicity probably secondary to Tylenol use Hypokalemia K= 2.5 Hx of Gastroparesis Hxo of pancreatitis - possibly related to ETOH Hx of renal insuffiencey Hx of depression FEN: NPO/IV meds/Fluids ID: ceftriaxone/Flagyl 7/24-7/16/18, Augmentin 7/27-7/29, Zosyn 7/29 =>> day 3  Vancomycin 7/29 =>>day 3 DVT:  SCD/INR 2.51 - liver injury   Plan:  Continue current Rx, check magnesium, I will let CCM correct the K+, so we don't have to many people writing orders.  Cholroseptic spray for NG comfort.   LOS: 7 days    Valerie Mitchell 08/05/2016 [phone]

## 2016-08-05 NOTE — Progress Notes (Signed)
Patient's acute liver injury continues to show marked improvement both clinically and biochemically.  Clinically, the patient looks about the same as yesterday, perhaps even a little more somnolent, but her mentation is better. She did tell me today both the month and the year, which she could not do yesterday. She did serial twos, albeit slowly, but completely accurately, which again she was not able to do when I checked her yesterday.  Her transaminases have fallen abruptly, her bilirubin has come down, and her INR is significantly improved, although still over 2. Her ammonia remains significantly elevated.  Impression:  Resolving acute liver failure (characterized by acute liver injury with superimposed encephalopathy), improving. Most likely etiology was significant acetaminophen ingestion prior to admission. However, this is conjectural.  Recommendation: I would recommend continuing acetylcysteine infusion until the patient's INR is less than 2 and her mentation is normal, or close to normal. I anticipate this will be tomorrow, based on current trends.  When I spoke yesterday afternoon with [facility] about [facility], she indicated that as long as the labs including the INR were improving, transfer was not necessary.  Florencia Reasonsobert V. Leonce Bale, M.D. Pager [phone] If no answer or after 5 PM call [phone]

## 2016-08-05 NOTE — Progress Notes (Signed)
eLink Physician-Brief Progress Note Patient Name: Valerie AdlerStephanie R Mitchell DOB: 1975/10/31 MRN: [id number]   eLink Physician Progress Note and Electrolyte Replacement  Patient Name: Valerie AdlerStephanie R Mitchell DOB: 1975/10/31 MRN: [id number]  Date of Service  08/05/2016   HPI/Events of Note    Recent Labs Lab 07/29/16 1930 08/02/16 0530 08/03/16 0536 08/03/16 2305 08/04/16 0106 08/05/16 0339  NA 137 139 142  --  142 143  K 3.7 3.3* 3.5  --  3.2* 2.5*  CL 103 110 111  --  111 108  CO2 19* 20* 21*  --  22 26  GLUCOSE 81 102* 147*  --  184* 182*  BUN 10 12 16   --  11 <5*  CREATININE 0.99 0.67 0.65 0.53 0.62 0.42*  CALCIUM 8.5* 8.1* 7.9*  --  7.6* 7.2*  MG  --   --  2.0  --  1.8 1.6*  PHOS  --   --   --   --  <1.0* 1.5*    Recent Labs Lab 08/03/16 2157 08/04/16 1058 08/05/16 0339  INR 3.28 2.84 2.51     Estimated Creatinine Clearance: 90 mL/min (A) (by C-G formula based on SCr of 0.42 mg/dL (L)).  Intake/Output      07/30 0701 - 07/31 0700   I.V. (mL/kg) 1757.8 (25.3)   Other 425   NG/GT 100   IV Piggyback 600   Total Intake(mL/kg) 2882.8 (41.4)   Urine (mL/kg/hr) 3800 (2.3)   Emesis/NG output 600   Total Output 4400   Net -1517.2       Stool Occurrence 2 x    - I/O DETAILED x 24h    Total I/O In: 758.1 [I.V.:658.1; NG/GT:100] Out: 1325 [Urine:1325] - I/O THIS SHIFT    ASSESSMENT Hypokalemia Hypomagnesemia Hypophosphatemia   eICURN Interventions  Replete all 3 Change d5 half with kcl to d5 half without kcl   ASSESSMENT: MAJOR ELECTROLYTE      Dr. Kalman ShanMurali Daisha Filosa, M.D., [facility]C.P Pulmonary and Critical Care Medicine Staff Physician [facility] [place] [facility] Pager: [phone], If no answer or between  15:00h - 7:00h: call [phone]  08/05/2016 5:08 AM       Intervention Category Major Interventions: Electrolyte abnormality - evaluation and management  Babara Buffalo 08/05/2016, 5:08 AM

## 2016-08-05 NOTE — Progress Notes (Signed)
CRITICAL VALUE ALERT  Critical Value:  K+ 2.5  Date & Time Notied:  08/05/16 0400  Provider Notified: PCCM  Orders Received/Actions taken: Replace K+, Mg2+,  Start D5 -0.45% NS.

## 2016-08-05 NOTE — Progress Notes (Signed)
Pharmacy Antibiotic Note  Valerie Mitchell is a 41 y.o. female admitted on 07/29/2016 with sepsis and perforated appendicitis requiring surgical management on 7/25. Postop course complicated by progressive confusion and elevated LFT's requiring transfer to ICU.  Pharmacy has been consulted for vancomycin and zosyn dosing for peritoneal abscess.   Today, 08/05/2016  Day #3 Zosyn, #2 Vancomycin Afebrile WBC 6.8, improved SCr 0.42, stable CrCl ~100 ml/min/1.6673m2 (normalized) Cultures negative to date  Plan:   Continue Zosyn 3.375gm IV q8h (4hr extended infusions)  Continue Vancomycin 750mg  IV q8h (received 1g loading dose 7/30 early AM)  Check vancomycin trough today at 17:30 prior to 5th maintenance dose, goal = 15-20 mcg/ml  Follow up renal function & cultures, de-escalate as appropriate  Height: 5\' 7"  (170.2 cm) Weight: 153 lb 7 oz (69.6 kg) IBW/kg (Calculated) : 61.6  Temp (24hrs), Avg:98.3 F (36.8 C), Min:97.4 F (36.3 C), Max:98.7 F (37.1 C)   Recent Labs Lab 07/29/16 1947  08/02/16 0530 08/03/16 0536 08/03/16 2156 08/03/16 2305 08/04/16 0106 08/04/16 0515 08/05/16 0339  WBC  --   < > 14.5* 11.6*  --  10.6* 10.5  --  6.8  CREATININE  --   --  0.67 0.65  --  0.53 0.62  --  0.42*  LATICACIDVEN 1.82  --   --   --  1.9  --  2.3* 1.7  --   < > = values in this interval not displayed.  Estimated Creatinine Clearance: 90 mL/min (A) (by C-G formula based on SCr of 0.42 mg/dL (L)).    No Known Allergies  Antimicrobials this admission: 7/24 Zosyn x1; 7/29>> 7/25 Rocephin + Flagyl>>7/27 7/27 Augmentin>>7/29 7/29 vanc 7/29>>  Dose adjustments: 7/31 VT at 17:30 = ___ on 750mg  q8h  Microbiology results: 7/24UCx: multiple sp, recollect  7/24 Surgical PCR: +SA, -MRSA 7/29 MRSA PCR: neg 7/29 BCx2: 7/29 Ucx: 7/30 Hepatitis panel:  Thank you for allowing pharmacy to be a part of this patient's care.  Loralee PacasErin Jaria Conway, PharmD, BCPS Pager: [phone] 08/05/2016  8:40 AM

## 2016-08-05 NOTE — Progress Notes (Signed)
PULMONARY / CRITICAL CARE MEDICINE   Name: Valerie AdlerStephanie R Mitchell MRN: [id number] DOB: 01/14/1975    ADMISSION DATE:  07/29/2016 CONSULTATION DATE:  08/03/2016  REFERRING MD:  Magnus IvanBlackman  CHIEF COMPLAINT:  Confusion  BRIEF:   41 y/o female with a past history of pancreatitis and gastroparesis presented on 7/24 with belly pain due to perforated appendicitis requiring surgical management.  On post op day 5 she had progressive confusion and elevated LFT's requiring transfer to the ICU.   Pt was using home supply of tylenol while inpatient.    SUBJECTIVE:  Mother at bedside > reports pt had a good night. No acute events.  Mentation significantly improved.  RN reports electrolyte disturbances / replacement in progress.  Tmax 98/6, WBC 6.8.   VITAL SIGNS: BP 134/73   Pulse 81   Temp 98.6 F (37 C) (Oral)   Resp 14   Ht 5\' 7"  (1.702 m)   Wt 153 lb 7 oz (69.6 kg)   LMP 07/22/2016 Comment: negative urein pregnancy test 07/29/16  SpO2 100%   BMI 24.03 kg/m   HEMODYNAMICS:    VENTILATOR SETTINGS:    INTAKE / OUTPUT: I/O last 3 completed shifts: In: 6518.1 [I.V.:3599.7; Other:425; NG/GT:100; IV Piggyback:2393.3] Out: 9562 [ZHYQM:[id number] [Urine:6975; Emesis/NG output:1100]  PHYSICAL EXAMINATION: General: adult female in NAD lying in bed HEENT: MM pink/moist, NGT in place PSY: calm/appropriate Neuro: AAOx4, speech clear, MAE / generalized weakness  CV: s1s2 rrr, no m/r/g PULM: even/non-labored, lungs bilaterally clear  GI: soft, mild tenderness to palpation, surgical wounds clean Extremities: warm/dry, no edema  Skin: no rashes or lesions  LABS:  BMET  Recent Labs Lab 08/03/16 0536 08/03/16 2305 08/04/16 0106 08/05/16 0339  NA 142  --  142 143  K 3.5  --  3.2* 2.5*  CL 111  --  111 108  CO2 21*  --  22 26  BUN 16  --  11 <5*  CREATININE 0.65 0.53 0.62 0.42*  GLUCOSE 147*  --  184* 182*    Electrolytes  Recent Labs Lab 08/03/16 0536 08/04/16 0106 08/05/16 0339  CALCIUM 7.9*  7.6* 7.2*  MG 2.0 1.8 1.6*  PHOS  --  <1.0* 1.5*    CBC  Recent Labs Lab 08/03/16 2305 08/04/16 0106 08/05/16 0339  WBC 10.6* 10.5 6.8  HGB 10.6* 11.1* 10.6*  HCT 31.3* 32.9* 30.6*  PLT 270 269 247    Coag's  Recent Labs Lab 08/03/16 2157 08/04/16 1058 08/05/16 0339  INR 3.28 2.84 2.51    Sepsis Markers  Recent Labs Lab 08/03/16 2156 08/04/16 0106 08/04/16 0515  LATICACIDVEN 1.9 2.3* 1.7    ABG  Recent Labs Lab 08/03/16 2025  PHART 7.439  PCO2ART 34.2  PO2ART 73.0*    Liver Enzymes  Recent Labs Lab 08/03/16 1626 08/04/16 0106 08/05/16 0339  AST 2,362* 1,186* 348*  ALT 4,495* 3,934* 1,998*  ALKPHOS 356* 299* 284*  BILITOT 3.4* 2.7* 2.5*  ALBUMIN 3.0* 2.7* 2.5*    Cardiac Enzymes No results for input(s): TROPONINI, PROBNP in the last 168 hours.  Glucose  Recent Labs Lab 08/03/16 1117 08/03/16 2027  GLUCAP 142* 159*    Imaging No results found.   STUDIES:  7/24 CT ab/pelv> enlarged appendix with significant inflammatory changes around with no extraluminal gas, non-obstructive kidney stones 7/29 CT head > normal head CT 7/30 CT ab/pelv> s/p recent appy with small loculated fluid adjacent to the appy clip, likely abscess, prior chole, thickenined inflamed loops of small bowel, fatty liver,  non-obstructive renal calculi, parcially visulalized bilateral pleural effusions  CULTURES: 7/29 blood >> 7/29 urine >> 7/24 urine >> multiple species  ANTIBIOTICS: 7/24 ceftriaxone/flagyl > 7/26 7/24 zosyn x1 7/29 zosyn >> 7/29 vanc >>  SIGNIFICANT EVENTS: 7/25 lap appy > perforated appendicitis, resected, no abscess   LINES/TUBES:   DISCUSSION: 41 y/o female with a past history 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  She also has evidence of a peritoneal abscess seen on 7/30 CT abdom/pelvis.  DDx of liver  injury includes acetaminophen induced injury (family reports she takes at least 2,000gm daily chronically and took more than that just prior to admission) vs less likely viral vs other pharmacologic cause.  Given confusion, elevated ammonia and elevated INR prognosis is guarded.  LFT's significantly improved 7/31 as well as mentation.    ASSESSMENT / PLAN:  GASTROINTESTINAL A:   Appendicitis s/p appy Peritoneal abscess Acute liver failure - suspect multifactorial in the setting of tylenol, pelvic infection, anesthesia.  Hepatitis panel negative.  Baseline history of pancreatitis, gastroparesis; history of unexplained liver injury 2012 Fatty liver S/p cholecystecomy P:   GI following > feels liver injury likely multifactorial, rec's to continue acetylcysteine No further APAP Minimize sedating medications  Nutrition per surgery  Trend ammonia, LFT's IR following > plan for drainage of peritoneal abscess once INR within normal limits Continue acetylcysteine for now  PULMONARY A: No acute issues P:   Aspiration precautions  Pulmonary hygiene  CARDIOVASCULAR A:  No acute issues P:  Tele monitoring   RENAL A:   Hypophosphatemia Hypokalemia P:   Trend BMP / urinary output Replace electrolytes as indicated > Mg, K, Phos replaced 7/31 Avoid nephrotoxic agents, ensure adequate renal perfusion  HEMATOLOGIC A:   Anemia without bleeding P:  Trend CBC  Monitor for bleeding   INFECTIOUS A:   Peritoneal abscess post appy P:   Follow cultures  Continue vanco / zosyn  ENDOCRINE A:   No acute issues   P:   Monitor glucose   NEUROLOGIC A:   Acute encephalopathy P:   Supportive care Minimize sedating medications   FAMILY  - Updates: Mother updated at bedside.    Canary BrimBrandi Cassidi Modesitt, NP-C [place] [facility] Pgr: [phone] or if no answer [phone] 08/05/2016, 8:55 AM

## 2016-08-06 DIAGNOSIS — K353 Acute appendicitis with localized peritonitis: Principal | ICD-10-CM

## 2016-08-06 LAB — COMPREHENSIVE METABOLIC PANEL
ALT: 1371 U/L — ABNORMAL HIGH (ref 14–54)
ANION GAP: 9 (ref 5–15)
AST: 136 U/L — AB (ref 15–41)
Albumin: 2.5 g/dL — ABNORMAL LOW (ref 3.5–5.0)
Alkaline Phosphatase: 255 U/L — ABNORMAL HIGH (ref 38–126)
BILIRUBIN TOTAL: 2.7 mg/dL — AB (ref 0.3–1.2)
CHLORIDE: 104 mmol/L (ref 101–111)
CO2: 29 mmol/L (ref 22–32)
Calcium: 7.7 mg/dL — ABNORMAL LOW (ref 8.9–10.3)
Creatinine, Ser: 0.53 mg/dL (ref 0.44–1.00)
GFR calc Af Amer: >60 mL/min (ref >60)
Glucose, Bld: 138 mg/dL — ABNORMAL HIGH (ref 65–99)
POTASSIUM: 2.6 mmol/L — AB (ref 3.5–5.1)
Sodium: 142 mmol/L (ref 135–145)
TOTAL PROTEIN: 5.2 g/dL — AB (ref 6.5–8.1)

## 2016-08-06 LAB — HEPATITIS PANEL, ACUTE
HCV Ab: <0.1 s/co ratio (ref 0.0–0.9)
Hep A IgM: NEGATIVE
Hep B C IgM: NEGATIVE
Hepatitis B Surface Ag: NEGATIVE

## 2016-08-06 LAB — PROTIME-INR
INR: 1.92
PROTHROMBIN TIME: 22.2 s — AB (ref 11.4–15.2)

## 2016-08-06 LAB — CBC
HEMATOCRIT: 34.1 % — AB (ref 36.0–46.0)
Hemoglobin: 11.7 g/dL — ABNORMAL LOW (ref 12.0–15.0)
MCH: 31.2 pg (ref 26.0–34.0)
MCHC: 34.3 g/dL (ref 30.0–36.0)
MCV: 90.9 fL (ref 78.0–100.0)
PLATELETS: 258 10*3/uL (ref 150–400)
RBC: 3.75 MIL/uL — ABNORMAL LOW (ref 3.87–5.11)
RDW: 15 % (ref 11.5–15.5)
WBC: 10.6 10*3/uL — AB (ref 4.0–10.5)

## 2016-08-06 LAB — PHOSPHORUS: PHOSPHORUS: 2.2 mg/dL — AB (ref 2.5–4.6)

## 2016-08-06 LAB — MAGNESIUM: MAGNESIUM: 2 mg/dL (ref 1.7–2.4)

## 2016-08-06 LAB — ANTINUCLEAR ANTIBODIES, IFA: ANTINUCLEAR ANTIBODIES, IFA: NEGATIVE

## 2016-08-06 MED ORDER — POTASSIUM PHOSPHATES 15 MMOLE/5ML IV SOLN
30.0000 mmol | Freq: Once | INTRAVENOUS | Status: AC
  Administered 2016-08-06: 30 mmol via INTRAVENOUS
  Filled 2016-08-06: qty 10

## 2016-08-06 MED ORDER — POTASSIUM CHLORIDE 10 MEQ/100ML IV SOLN
10.0000 meq | INTRAVENOUS | Status: AC
Start: 2016-08-06 — End: 2016-08-06
  Administered 2016-08-06 (×4): 10 meq via INTRAVENOUS
  Filled 2016-08-06 (×4): qty 100

## 2016-08-06 NOTE — Progress Notes (Signed)
7 Days Post-Op    CC: abdominal pain  Subjective: She looks and feels much better, really wants to get the NG out.  She is taking allot of ice that is not being recorded.  Objective: Vital signs in last 24 hours: Temp:  [97.3 F (36.3 C)-99.1 F (37.3 C)] 98 F (36.7 C) (08/01 0402) Pulse Rate:  [73-84] 78 (08/01 0700) Resp:  [10-23] 12 (08/01 0700) BP: (109-127)/(57-77) 120/72 (08/01 0700) SpO2:  [92 %-97 %] 96 % (08/01 0700) Weight:  [62.6 kg (138 lb 0.1 oz)] 62.6 kg (138 lb 0.1 oz) (08/01 0500) Last BM Date: 08/04/16 2742 IV 2450 urine 1500 NG Afebrile, VSS LFT's continue to improve, Bilirubin is stable INR 1.92 this AM Mag up to 2   Intake/Output from previous day: 07/31 0701 - 08/01 0700 In: 2742.2 [I.V.:1752.2; NG/GT:30; IV Piggyback:960] Out: 3950 [Urine:2450; Emesis/NG output:1500] Intake/Output this shift: No intake/output data recorded.  General appearance: alert, cooperative and no distress Resp: clear to auscultation bilaterally GI: some intermittent tenderness RUQ. few BS, not tender to palpation.  Sites all look fine.    Lab Results:   Recent Labs  08/05/16 0339 08/06/16 0316  WBC 6.8 10.6*  HGB 10.6* 11.7*  HCT 30.6* 34.1*  PLT 247 258    BMET  Recent Labs  08/05/16 0339 08/06/16 0316  NA 143 142  K 2.5* 2.6*  CL 108 104  CO2 26 29  GLUCOSE 182* 138*  BUN <5* <5*  CREATININE 0.42* 0.53  CALCIUM 7.2* 7.7*   PT/INR  Recent Labs  08/05/16 0339 08/06/16 0316  LABPROT 27.6* 22.2*  INR 2.51 1.92     Recent Labs Lab 08/03/16 1626 08/04/16 0106 08/05/16 0339 08/06/16 0316  AST 2,362* 1,186* 348* 136*  ALT 4,495* 3,934* 1,998* 1,371*  ALKPHOS 356* 299* 284* 255*  BILITOT 3.4* 2.7* 2.5* 2.7*  PROT 5.2* 5.2* 4.9* 5.2*  ALBUMIN 3.0* 2.7* 2.5* 2.5*     Lipase     Component Value Date/Time   LIPASE 33 08/03/2016 1626     . acetylcysteine 15 mg/kg/hr (08/05/16 2239)  . dextrose 5 % and 0.45% NaCl 75 mL/hr at 08/06/16  0333  . piperacillin-tazobactam (ZOSYN)  IV 3.375 g (08/06/16 0627)  . potassium chloride 10 mEq (08/06/16 0740)  . potassium phosphate IVPB (mmol)     Medications:   Anti-infectives    Start     Dose/Rate Route Frequency Ordered Stop   08/04/16 1000  vancomycin (VANCOCIN) IVPB 750 mg/150 ml premix  Status:  Discontinued     750 mg 150 mL/hr over 60 Minutes Intravenous Every 8 hours 08/04/16 0033 08/05/16 1005   08/03/16 2300  piperacillin-tazobactam (ZOSYN) IVPB 3.375 g  Status:  Discontinued     3.375 g 12.5 mL/hr over 240 Minutes Intravenous  Once 08/03/16 2247 08/03/16 2249   08/03/16 2300  vancomycin (VANCOCIN) IVPB 1000 mg/200 mL premix     1,000 mg 200 mL/hr over 60 Minutes Intravenous  Once 08/03/16 2247 08/04/16 0311   08/03/16 1400  piperacillin-tazobactam (ZOSYN) IVPB 3.375 g     3.375 g 12.5 mL/hr over 240 Minutes Intravenous Every 8 hours 08/03/16 1148     08/01/16 1200  amoxicillin-clavulanate (AUGMENTIN) 875-125 MG per tablet 1 tablet  Status:  Discontinued     1 tablet Oral Every 12 hours 08/01/16 1004 08/03/16 1143   07/31/16 0000  amoxicillin-clavulanate (AUGMENTIN) 875-125 MG tablet     1 tablet Oral 2 times daily 07/31/16 0851  07/30/16 0200  metroNIDAZOLE (FLAGYL) IVPB 500 mg  Status:  Discontinued     500 mg 100 mL/hr over 60 Minutes Intravenous Every 8 hours 07/30/16 0003 08/01/16 1004   07/30/16 0100  cefTRIAXone (ROCEPHIN) 2 g in dextrose 5 % 50 mL IVPB  Status:  Discontinued     2 g 100 mL/hr over 30 Minutes Intravenous Every 24 hours 07/30/16 0003 08/01/16 1004   07/29/16 2115  piperacillin-tazobactam (ZOSYN) IVPB 3.375 g     3.375 g 100 mL/hr over 30 Minutes Intravenous  Once 07/29/16 2107 07/29/16 2201      Assessment/Plan Acute appendicitis with perforation S/p laparoscopic appendectomy 07/30/16 Post op leak with abscess - IR drain requested  INR down to 1.92 Acute liver toxicity probably secondary to Tylenol use  ALT/AST improving, bilirubin  is stable Hypokalemia K= 2.5 =>>2.6 today  K+ ordered Hx of Gastroparesis Hxo of pancreatitis - possibly related to ETOH Hx of renal insuffiencey Hx of depression FEN: NPO/IV meds/Fluids ID: ceftriaxone/Flagyl 7/24-7/16/18, Augmentin 7/27-7/29,  Vancomycin 7/29-7/31 Zosyn 7/29 =>> day 4 DVT:  SCD/INR 2.51 - liver injury    Plan:  We need to get the K+ up to 4.0 range.  Will defer to CCM.  Hopefully IR can see and get drain placed.  I will try clamping trials  And get her up walking in halls here.  She really wants to get rid of the NG.   LOS: 8 days    JENNINGS,WILLARD 08/06/2016 [phone]

## 2016-08-06 NOTE — Progress Notes (Signed)
Subjective: Wants NGT out.  Objective: Vital signs in last 24 hours: Temp:  [97.3 F (36.3 C)-99.1 F (37.3 C)] 97.3 F (36.3 C) (08/01 1200) Pulse Rate:  [75-84] 75 (08/01 1100) Resp:  [12-23] 14 (08/01 1200) BP: (110-127)/(56-99) 120/77 (08/01 1100) SpO2:  [92 %-97 %] 97 % (08/01 1100) Weight:  [138 lb 0.1 oz (62.6 kg)] 138 lb 0.1 oz (62.6 kg) (08/01 0500) Weight change: -15 lb 6.9 oz (-7 kg) Last BM Date: 08/04/16  PE: GEN:  Deconditioned, weak-appearing, but not acutely toxic HEENT:  NGT in place NEURO:  Alert, oriented, can answer questions appropriately  Lab Results: CBC    Component Value Date/Time   WBC 10.6 (H) 08/06/2016 0316   RBC 3.75 (L) 08/06/2016 0316   HGB 11.7 (L) 08/06/2016 0316   HCT 34.1 (L) 08/06/2016 0316   HCT 34 03/21/2010   PLT 258 08/06/2016 0316   MCV 90.9 08/06/2016 0316   MCH 31.2 08/06/2016 0316   MCHC 34.3 08/06/2016 0316   RDW 15.0 08/06/2016 0316   LYMPHSABS 0.8 07/29/2016 1930   MONOABS 0.6 07/29/2016 1930   EOSABS 0.0 07/29/2016 1930   BASOSABS 0.0 07/29/2016 1930   CMP     Component Value Date/Time   NA 142 08/06/2016 0316   K 2.6 (LL) 08/06/2016 0316   CL 104 08/06/2016 0316   CO2 29 08/06/2016 0316   GLUCOSE 138 (H) 08/06/2016 0316   BUN <5 (L) 08/06/2016 0316   CREATININE 0.53 08/06/2016 0316   CREATININE 1.05 03/27/2010 1650   CALCIUM 7.7 (L) 08/06/2016 0316   PROT 5.2 (L) 08/06/2016 0316   ALBUMIN 2.5 (L) 08/06/2016 0316   ALBUMIN 4.3 03/20/2010   AST 136 (H) 08/06/2016 0316   AST 12 03/20/2010   ALT 1,371 (H) 08/06/2016 0316   ALKPHOS 255 (H) 08/06/2016 0316   ALKPHOS 95 03/20/2010   BILITOT 2.7 (H) 08/06/2016 0316   BILITOT 0.3 03/20/2010   GFRNONAA >60 08/06/2016 0316   GFRAA >60 08/06/2016 0316   INR 1.92  Assessment:  1.  Acute liver injury, acetaminophen the presumed culprit.  Improving mentation and coagulopathy, and decrease in liver enzymes.  Plan:  1.  Can stop n-acetylcysteine (NAC). 2.   [facility]. 3.  Check LFTs and PT/INR daily.   Freddy JakschOUTLAW,Valerie Mitchell M 08/06/2016, 1:35 PM   Cell [phone] If no answer or after 5 PM call [phone]

## 2016-08-06 NOTE — Progress Notes (Signed)
PULMONARY / CRITICAL CARE MEDICINE   Name: Valerie AdlerStephanie R Bilton MRN: [id number] DOB: July 20, 1975    ADMISSION DATE:  07/29/2016 CONSULTATION DATE:  08/03/2016  REFERRING MD:  Magnus IvanBlackman  CHIEF COMPLAINT:  Confusion  BRIEF:   41 y/o female with a past history of pancreatitis and gastroparesis presented on 7/24 with belly pain due to perforated appendicitis requiring surgical management.  On post op day 5 she had progressive confusion and elevated LFT's requiring transfer to the ICU.   Pt was using home supply of tylenol while inpatient.    SUBJECTIVE:  More awake , bothered by NGT Afebrile   VITAL SIGNS: BP 113/75 (BP Location: Left Arm)   Pulse 78   Temp 98 F (36.7 C) (Oral)   Resp 12   Ht 5\' 7"  (1.702 m)   Wt 138 lb 0.1 oz (62.6 kg) Comment: Last wt filed in error-wt of SCD included  LMP 07/22/2016 Comment: negative urein pregnancy test 07/29/16  SpO2 97%   BMI 21.62 kg/m   HEMODYNAMICS:    VENTILATOR SETTINGS:    INTAKE / OUTPUT: I/O last 3 completed shifts: In: 4150.8 [I.V.:2910.8; NG/GT:130; IV Piggyback:1110] Out: 5775 [Urine:3775; Emesis/NG output:2000]  PHYSICAL EXAMINATION: General: adult female in NAD lying in bed HEENT: MM pink/moist, NGT in place , icterus + PSY: calm/appropriate Neuro: AAOx4, speech clear, MAE / generalized weakness  CV: s1s2 rrr, no m/r/g PULM: even/non-labored, lungs bilaterally clear  GI: soft, mild tenderness to palpation, surgical wounds clean Extremities: warm/dry, no edema  Skin: no rashes or lesions  LABS:  BMET  Recent Labs Lab 08/04/16 0106 08/05/16 0339 08/06/16 0316  NA 142 143 142  K 3.2* 2.5* 2.6*  CL 111 108 104  CO2 22 26 29   BUN 11 <5* <5*  CREATININE 0.62 0.42* 0.53  GLUCOSE 184* 182* 138*    Electrolytes  Recent Labs Lab 08/04/16 0106 08/05/16 0339 08/06/16 0316  CALCIUM 7.6* 7.2* 7.7*  MG 1.8 1.6*  1.6* 2.0  PHOS <1.0* 1.5* 2.2*    CBC  Recent Labs Lab 08/04/16 0106 08/05/16 0339  08/06/16 0316  WBC 10.5 6.8 10.6*  HGB 11.1* 10.6* 11.7*  HCT 32.9* 30.6* 34.1*  PLT 269 247 258    Coag's  Recent Labs Lab 08/04/16 1058 08/05/16 0339 08/06/16 0316  INR 2.84 2.51 1.92    Sepsis Markers  Recent Labs Lab 08/03/16 2156 08/04/16 0106 08/04/16 0515  LATICACIDVEN 1.9 2.3* 1.7    ABG  Recent Labs Lab 08/03/16 2025  PHART 7.439  PCO2ART 34.2  PO2ART 73.0*    Liver Enzymes  Recent Labs Lab 08/04/16 0106 08/05/16 0339 08/06/16 0316  AST 1,186* 348* 136*  ALT 3,934* 1,998* 1,371*  ALKPHOS 299* 284* 255*  BILITOT 2.7* 2.5* 2.7*  ALBUMIN 2.7* 2.5* 2.5*    Cardiac Enzymes No results for input(s): TROPONINI, PROBNP in the last 168 hours.  Glucose  Recent Labs Lab 08/03/16 1117 08/03/16 2027  GLUCAP 142* 159*    Imaging No results found.   STUDIES:  7/24 CT ab/pelv> enlarged appendix with significant inflammatory changes around with no extraluminal gas, non-obstructive kidney stones 7/29 CT head > normal head CT 7/30 CT ab/pelv> s/p recent appy with small loculated fluid adjacent to the appy clip, likely abscess, prior chole, thickenined inflamed loops of small bowel, fatty liver, non-obstructive renal calculi, parcially visulalized bilateral pleural effusions  CULTURES: 7/29 blood >>ng 7/29 urine >>ng 7/24 urine >> multiple species  ANTIBIOTICS: 7/24 ceftriaxone/flagyl > 7/26 7/24 zosyn x1 7/29  zosyn >> 7/29 vanc >>8/1  SIGNIFICANT EVENTS: 7/25 lap appy > perforated appendicitis, resected, no abscess   LINES/TUBES:   DISCUSSION: 41 y/o female with a past history of alcohol abuse (sober for 2.5 years per mother 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  She also had evidence of a peritoneal abscess seen on 7/30 CT abdom/pelvis.  DDx of liver injury includes acetaminophen induced injury (family reports she takes at least 2,000gm daily chronically  and took more than that just prior to admission) vs less likely viral vs other pharmacologic cause.     ASSESSMENT / PLAN:  GASTROINTESTINAL A:   Appendicitis s/p appy Peritoneal abscess Acute liver failure - suspect multifactorial in the setting of tylenol, pelvic infection, anesthesia.  Hepatitis panel negative.  Baseline history of pancreatitis, gastroparesis; history of unexplained liver injury 2012 Fatty liver S/p cholecystecomy P:   GI following > feels liver injury likely multifactorial, continue acetylcysteine until LFTs normalised, INR now < 2 Nutrition per surgery -clamping NGT & allowing ice Trend LFT's IR following > plan for drainage of peritoneal abscess now that INR lower    RENAL A:   Hypophosphatemia Hypokalemia P:   Trend BMP / urinary output Replace electrolytes as indicated  Avoid nephrotoxic agents, ensure adequate renal perfusion   INFECTIOUS A:   Peritoneal abscess post appy P:   Dc vanc Continue  zosyn   NEUROLOGIC A:   Acute encephalopathy -resolved P:   Supportive care mobilise   FAMILY  - Updates: Mother updated at bedside.    Cyril Mourningakesh Aurore Redinger MD. Tonny BollmanFCCP. [facility] Pager [phone] If no response call [phone]    08/06/2016, 10:53 AM

## 2016-08-06 NOTE — Progress Notes (Signed)
eLink Physician-Brief Progress Note Patient Name: Shaaron AdlerStephanie R Males DOB: November 21, 1975 MRN: [id number]   Date of Service  08/06/2016  HPI/Events of Note  K 2.6, Phos 2.2  eICU Interventions  Repleted     Intervention Category Intermediate Interventions: Electrolyte abnormality - evaluation and management  Kyren Knick 08/06/2016, 4:26 AM

## 2016-08-07 ENCOUNTER — Encounter (HOSPITAL_COMMUNITY): Payer: Self-pay | Admitting: Radiology

## 2016-08-07 ENCOUNTER — Inpatient Hospital Stay (HOSPITAL_COMMUNITY): Payer: Self-pay

## 2016-08-07 LAB — COMPREHENSIVE METABOLIC PANEL
ALT: 992 U/L — ABNORMAL HIGH (ref 14–54)
ANION GAP: 8 (ref 5–15)
AST: 90 U/L — ABNORMAL HIGH (ref 15–41)
Albumin: 2.7 g/dL — ABNORMAL LOW (ref 3.5–5.0)
Alkaline Phosphatase: 235 U/L — ABNORMAL HIGH (ref 38–126)
BILIRUBIN TOTAL: 2.2 mg/dL — AB (ref 0.3–1.2)
BUN: 5 mg/dL — ABNORMAL LOW (ref 6–20)
CALCIUM: 8 mg/dL — AB (ref 8.9–10.3)
CO2: 26 mmol/L (ref 22–32)
Chloride: 106 mmol/L (ref 101–111)
Creatinine, Ser: 0.4 mg/dL — ABNORMAL LOW (ref 0.44–1.00)
Glucose, Bld: 88 mg/dL (ref 65–99)
Potassium: 2.9 mmol/L — ABNORMAL LOW (ref 3.5–5.1)
Sodium: 140 mmol/L (ref 135–145)
TOTAL PROTEIN: 5.5 g/dL — AB (ref 6.5–8.1)

## 2016-08-07 LAB — BASIC METABOLIC PANEL
ANION GAP: 9 (ref 5–15)
BUN: 5 mg/dL — ABNORMAL LOW (ref 6–20)
CALCIUM: 8.3 mg/dL — AB (ref 8.9–10.3)
CO2: 25 mmol/L (ref 22–32)
Chloride: 108 mmol/L (ref 101–111)
Creatinine, Ser: 0.57 mg/dL (ref 0.44–1.00)
GLUCOSE: 134 mg/dL — AB (ref 65–99)
Potassium: 3 mmol/L — ABNORMAL LOW (ref 3.5–5.1)
SODIUM: 142 mmol/L (ref 135–145)

## 2016-08-07 LAB — PROTIME-INR
INR: 1.58
Prothrombin Time: 19 seconds — ABNORMAL HIGH (ref 11.4–15.2)

## 2016-08-07 LAB — CBC
HEMATOCRIT: 33.4 % — AB (ref 36.0–46.0)
HEMOGLOBIN: 11 g/dL — AB (ref 12.0–15.0)
MCH: 31 pg (ref 26.0–34.0)
MCHC: 32.9 g/dL (ref 30.0–36.0)
MCV: 94.1 fL (ref 78.0–100.0)
Platelets: 197 10*3/uL (ref 150–400)
RBC: 3.55 MIL/uL — ABNORMAL LOW (ref 3.87–5.11)
RDW: 15.3 % (ref 11.5–15.5)
WBC: 13.6 10*3/uL — ABNORMAL HIGH (ref 4.0–10.5)

## 2016-08-07 MED ORDER — POTASSIUM CHLORIDE 20 MEQ/15ML (10%) PO SOLN
40.0000 meq | Freq: Once | ORAL | Status: AC
Start: 2016-08-07 — End: 2016-08-07
  Administered 2016-08-07: 40 meq
  Filled 2016-08-07: qty 30

## 2016-08-07 MED ORDER — POTASSIUM CHLORIDE 10 MEQ/100ML IV SOLN: 10.0000 meq | INTRAVENOUS | Status: DC

## 2016-08-07 MED ORDER — MAGNESIUM SULFATE 2 GM/50ML IV SOLN
2.0000 g | Freq: Once | INTRAVENOUS | Status: AC
Start: 2016-08-07 — End: 2016-08-07
  Administered 2016-08-07: 2 g via INTRAVENOUS
  Filled 2016-08-07: qty 50

## 2016-08-07 MED ORDER — MIDAZOLAM HCL 2 MG/2ML IJ SOLN
INTRAMUSCULAR | Status: AC
  Filled 2016-08-07: qty 4

## 2016-08-07 MED ORDER — POTASSIUM CHLORIDE 10 MEQ/100ML IV SOLN
10.0000 meq | INTRAVENOUS | Status: DC
  Administered 2016-08-07: 10 meq via INTRAVENOUS
  Filled 2016-08-07: qty 100

## 2016-08-07 MED ORDER — POTASSIUM CHLORIDE 20 MEQ/15ML (10%) PO SOLN
40.0000 meq | Freq: Once | ORAL | Status: AC
  Administered 2016-08-07: 40 meq
  Filled 2016-08-07: qty 30

## 2016-08-07 MED ORDER — KCL IN DEXTROSE-NACL 40-5-0.9 MEQ/L-%-% IV SOLN
INTRAVENOUS | Status: DC
  Administered 2016-08-07 – 2016-08-08 (×3): via INTRAVENOUS
  Filled 2016-08-07 (×4): qty 1000

## 2016-08-07 MED ORDER — FENTANYL CITRATE (PF) 100 MCG/2ML IJ SOLN
INTRAMUSCULAR | Status: AC
  Filled 2016-08-07: qty 4

## 2016-08-07 NOTE — Progress Notes (Signed)
Update on clamping trials  After patient ambulated around the unit , RN resumed suction . 2hrs later patient had an NG tube output of 700 cc. Per MD orders, RN will continue with LIWS. Will continue to monitor output.

## 2016-08-07 NOTE — Progress Notes (Signed)
PULMONARY / CRITICAL CARE MEDICINE   Name: Valerie AdlerStephanie R Mitchell MRN: [id number] DOB: September 05, 1975    ADMISSION DATE:  07/29/2016 CONSULTATION DATE:  08/03/2016  REFERRING MD:  Magnus IvanBlackman  CHIEF COMPLAINT:  Confusion  BRIEF:   41 y/o female with a past history of pancreatitis and gastroparesis presented on 7/24 with belly pain due to perforated appendicitis requiring surgical management.  On post op day 5 she had progressive confusion and elevated LFT's requiring transfer to the ICU.   Pt was using home supply of tylenol while inpatient.    SUBJECTIVE:  Pt did not tolerate IV potassium / discomfort.  Pt anxious to have NGT out.  States she is excited for chicken broth.  VITAL SIGNS: BP 117/80   Pulse 77   Temp 98.4 F (36.9 C) (Oral)   Resp 14   Ht 5\' 7"  (1.702 m)   Wt 135 lb 5.8 oz (61.4 kg)   LMP 07/22/2016 Comment: negative urein pregnancy test 07/29/16  SpO2 94%   BMI 21.20 kg/m   HEMODYNAMICS:    VENTILATOR SETTINGS:    INTAKE / OUTPUT: I/O last 3 completed shifts: In: 2439 [P.O.:240; I.V.:2199] Out: 3075 [Urine:925; Emesis/NG output:2150]  PHYSICAL EXAMINATION: General: adult female in NAD  HEENT: MM pink/moist PSY: calm/appropriate  Neuro: AAOx4, speech clear, MAE, mentation improved / normalized  CV: s1s2 rrr, no m/r/g PULM: even/non-labored, lungs bilaterally clear  GI: soft, non-tender, bsx4 active, surgical incisions clean/dry/intact Extremities: warm/dry, no edema  Skin: no rashes or lesions  LABS:  BMET  Recent Labs Lab 08/05/16 0339 08/06/16 0316 08/07/16 0250  NA 143 142 140  K 2.5* 2.6* 2.9*  CL 108 104 106  CO2 26 29 26   BUN <5* <5* 5*  CREATININE 0.42* 0.53 0.40*  GLUCOSE 182* 138* 88    Electrolytes  Recent Labs Lab 08/04/16 0106 08/05/16 0339 08/06/16 0316 08/07/16 0250  CALCIUM 7.6* 7.2* 7.7* 8.0*  MG 1.8 1.6*  1.6* 2.0  --   PHOS <1.0* 1.5* 2.2*  --     CBC  Recent Labs Lab 08/05/16 0339 08/06/16 0316  08/07/16 0250  WBC 6.8 10.6* 13.6*  HGB 10.6* 11.7* 11.0*  HCT 30.6* 34.1* 33.4*  PLT 247 258 197    Coag's  Recent Labs Lab 08/05/16 0339 08/06/16 0316 08/07/16 0250  INR 2.51 1.92 1.58    Sepsis Markers  Recent Labs Lab 08/03/16 2156 08/04/16 0106 08/04/16 0515  LATICACIDVEN 1.9 2.3* 1.7    ABG  Recent Labs Lab 08/03/16 2025  PHART 7.439  PCO2ART 34.2  PO2ART 73.0*    Liver Enzymes  Recent Labs Lab 08/05/16 0339 08/06/16 0316 08/07/16 0250  AST 348* 136* 90*  ALT 1,998* 1,371* 992*  ALKPHOS 284* 255* 235*  BILITOT 2.5* 2.7* 2.2*  ALBUMIN 2.5* 2.5* 2.7*    Cardiac Enzymes No results for input(s): TROPONINI, PROBNP in the last 168 hours.  Glucose  Recent Labs Lab 08/03/16 1117 08/03/16 2027  GLUCAP 142* 159*    Imaging No results found.   STUDIES:  7/24 CT ab/pelv> enlarged appendix with significant inflammatory changes around with no extraluminal gas, non-obstructive kidney stones 7/29 CT head > normal head CT 7/30 CT ab/pelv> s/p recent appy with small loculated fluid adjacent to the appy clip, likely abscess, prior chole, thickenined inflamed loops of small bowel, fatty liver, non-obstructive renal calculi, parcially visulalized bilateral pleural effusions  CULTURES: 7/29 blood >>  7/29 urine >> negative 7/24 urine >> multiple species  ANTIBIOTICS: 7/24 ceftriaxone/flagyl >  7/26 7/24 zosyn x1 7/29 zosyn >> 7/29 vanc >> 8/1  SIGNIFICANT EVENTS: 7/25 lap appy > perforated appendicitis, resected, no abscess 8/02 Acetylcysteine stopped per GI  LINES/TUBES:   DISCUSSION: 41 y/o female with a past history of alcohol abuse (sober for 2.5 years per mother 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  She also had evidence of a peritoneal abscess seen on 7/30 CT abdom/pelvis.  DDx of liver injury includes acetaminophen induced injury (family reports she  takes at least 2,000gm daily chronically and took more than that just prior to admission) vs less likely viral vs other pharmacologic cause.     ASSESSMENT / PLAN:  GASTROINTESTINAL A:   Appendicitis s/p appy Peritoneal abscess Acute liver failure - suspect multifactorial in the setting of tylenol, pelvic infection, anesthesia.  Hepatitis panel negative.  Baseline history of pancreatitis, gastroparesis; history of unexplained liver injury 2012 Fatty liver S/p cholecystecomy P:   GI following > feels liver injury likely multifactorial, d/c'd acetylcysteine 8/2 Nutrition per surgery  Trend LFT's  IR following > pending drainage of peritoneal abscess   RENAL A:   Hypophosphatemia Hypokalemia P:   Trend BMP / urinary output Replace electrolytes as indicated, KCL 40 mEq PT x1 Avoid nephrotoxic agents, ensure adequate renal perfusion Ok per CCS to give K via NGT Leave NGT clamped unless she becomes nauseated / vomiting Discontinue foley Change maintenance IVF to D51/2NS with 40 mEq KCL  INFECTIOUS A:   Peritoneal abscess post appy P:   Continue zosyn  Await cultures   NEUROLOGIC A:   Acute Encephalopathy - resolved P:   Supportive care  PT efforts   FAMILY  - Updates: Patient updated on plan of care 8/2   Canary BrimBrandi Ollis, NP-C [facility] Pgr: [phone] or if no answer [phone] 08/07/2016, 9:02 AM

## 2016-08-07 NOTE — Progress Notes (Signed)
[facility] FOR [facility] LAB REPLACEMENT ONLY  The patient does apply [facility] based on the criteria listed below:   1. Is GFR >/= 40 ml/min? Yes.    Patient's GFR today is >60 2. Is urine output >/= 0.5 ml/kg/hr for the last 6 hours? Yes.   Patient's UOP is 0.95 ml/kg/hr 3. Is BUN < 60 mg/dL? Yes.    Patient's BUN today is 5 4. Abnormal electrolyte(s): 2.9 5. Ordered repletion with: per protocol 6. If a panic level lab has been reported, has the CCM MD in charge been notified? Yes.  .   Physician:  Dr. Brett FairyKasa  Joei Frangos, Dixon BoosMaria Samson 08/07/2016 5:12 AM

## 2016-08-07 NOTE — Progress Notes (Signed)
eLink Physician-Brief Progress Note Patient Name: Shaaron AdlerStephanie R Krawiec DOB: 12-Sep-1975 MRN: [id number]   Date of Service  08/07/2016  HPI/Events of Note  Potassium 3.0. Patient intolerant of IV potassium chloride. Has NG tube in place.   eICU Interventions  1. KCl 40 mEq via tube 1 2. A.m. labs already ordered      Intervention Category Intermediate Interventions: Electrolyte abnormality - evaluation and management  Lawanda CousinsJennings Sekai Nayak 08/07/2016, 11:00 PM

## 2016-08-07 NOTE — Progress Notes (Signed)
Patient unable to tolerate IV Potassium. E-link RN made aware. Since patient has had increased NG output (1050cc in 6 hrs) following clamp trial, will hold off on giving Potassium through the tube. Will wait for reevaluation by the rounding doctor.

## 2016-08-07 NOTE — Progress Notes (Signed)
8 Days Post-Op    CC:  Abdominal pain  Subjective: NG suction back on last PM, not really sure why.  She could not tolerate IV K+ yesterday.  She is hungry and really wants NG out.  Mentation is fine this AM, and yesterday.  Sites OK , waiting on IR.  INR down more this AM, LFT's continue to improve.  Objective: Vital signs in last 24 hours: Temp:  [97.3 F (36.3 C)-99.5 F (37.5 C)] 98.3 F (36.8 C) (08/02 0400) Pulse Rate:  [71-83] 77 (08/02 0600) Resp:  [12-22] 14 (08/02 0600) BP: (113-143)/(56-99) 117/80 (08/02 0600) SpO2:  [94 %-98 %] 94 % (08/02 0600) Weight:  [61.4 kg (135 lb 5.8 oz)] 61.4 kg (135 lb 5.8 oz) (08/02 0410) Last BM Date: 08/04/16 240 PO recorded 1223 IV 575 urine  NG last shift 1150 Afebrile, VSS LFT's continue to improve,  K+ up to 2.9 WBC is up some, H/H OK INR down to 1.58   Intake/Output from previous day: 08/01 0701 - 08/02 0700 In: 1463.1 [P.O.:240; I.V.:1223.1] Out: 1725 [Urine:575; Emesis/NG output:1150] Intake/Output this shift: No intake/output data recorded.  General appearance: alert, cooperative and no distress Resp: clear to auscultation bilaterally GI: soft, sore, sites OK  Lab Results:   Recent Labs  08/06/16 0316 08/07/16 0250  WBC 10.6* 13.6*  HGB 11.7* 11.0*  HCT 34.1* 33.4*  PLT 258 197    BMET  Recent Labs  08/06/16 0316 08/07/16 0250  NA 142 140  K 2.6* 2.9*  CL 104 106  CO2 29 26  GLUCOSE 138* 88  BUN <5* 5*  CREATININE 0.53 0.40*  CALCIUM 7.7* 8.0*   PT/INR  Recent Labs  08/06/16 0316 08/07/16 0250  LABPROT 22.2* 19.0*  INR 1.92 1.58     Recent Labs Lab 08/03/16 1626 08/04/16 0106 08/05/16 0339 08/06/16 0316 08/07/16 0250  AST 2,362* 1,186* 348* 136* 90*  ALT 4,495* 3,934* 1,998* 1,371* 992*  ALKPHOS 356* 299* 284* 255* 235*  BILITOT 3.4* 2.7* 2.5* 2.7* 2.2*  PROT 5.2* 5.2* 4.9* 5.2* 5.5*  ALBUMIN 3.0* 2.7* 2.5* 2.5* 2.7*     Lipase     Component Value Date/Time   LIPASE 33  08/03/2016 1626   . dextrose 5 % and 0.45% NaCl Stopped (08/06/16 1018)  . piperacillin-tazobactam (ZOSYN)  IV 3.375 g (08/07/16 0514)  . potassium chloride Stopped (08/07/16 0600)   . dextrose 5 % and 0.45% NaCl Stopped (08/06/16 1018)  . piperacillin-tazobactam (ZOSYN)  IV 3.375 g (08/07/16 0514)  . potassium chloride Stopped (08/07/16 0600)   Anti-infectives    Start     Dose/Rate Route Frequency Ordered Stop   08/04/16 1000  vancomycin (VANCOCIN) IVPB 750 mg/150 ml premix  Status:  Discontinued     750 mg 150 mL/hr over 60 Minutes Intravenous Every 8 hours 08/04/16 0033 08/05/16 1005   08/03/16 2300  piperacillin-tazobactam (ZOSYN) IVPB 3.375 g  Status:  Discontinued     3.375 g 12.5 mL/hr over 240 Minutes Intravenous  Once 08/03/16 2247 08/03/16 2249   08/03/16 2300  vancomycin (VANCOCIN) IVPB 1000 mg/200 mL premix     1,000 mg 200 mL/hr over 60 Minutes Intravenous  Once 08/03/16 2247 08/04/16 0311   08/03/16 1400  piperacillin-tazobactam (ZOSYN) IVPB 3.375 g     3.375 g 12.5 mL/hr over 240 Minutes Intravenous Every 8 hours 08/03/16 1148     08/01/16 1200  amoxicillin-clavulanate (AUGMENTIN) 875-125 MG per tablet 1 tablet  Status:  Discontinued  1 tablet Oral Every 12 hours 08/01/16 1004 08/03/16 1143   07/31/16 0000  amoxicillin-clavulanate (AUGMENTIN) 875-125 MG tablet     1 tablet Oral 2 times daily 07/31/16 0851     07/30/16 0200  metroNIDAZOLE (FLAGYL) IVPB 500 mg  Status:  Discontinued     500 mg 100 mL/hr over 60 Minutes Intravenous Every 8 hours 07/30/16 0003 08/01/16 1004   07/30/16 0100  cefTRIAXone (ROCEPHIN) 2 g in dextrose 5 % 50 mL IVPB  Status:  Discontinued     2 g 100 mL/hr over 30 Minutes Intravenous Every 24 hours 07/30/16 0003 08/01/16 1004   07/29/16 2115  piperacillin-tazobactam (ZOSYN) IVPB 3.375 g     3.375 g 100 mL/hr over 30 Minutes Intravenous  Once 07/29/16 2107 07/29/16 2201       Medications:   Assessment/Plan Acute appendicitis with  perforation S/p laparoscopic appendectomy 07/30/16, Dr. Magnus IvanBlackman;  POD 6 Post op leak with abscess Acute liver toxicity probably secondary to Tylenol use Metabolic encephalopathy - improving Hypokalemia K= 2.5  >> 2.9 today Hx of Gastroparesis Hxo of pancreatitis - possibly related to ETOH Hx of renal insuffiencey Hx of depression FEN: NPO/IV meds/Fluids ID: ceftriaxone/Flagyl 7/24-7/16/18, Augmentin 7/27-7/29, Vancomycin 7/29-7/31/18, Zosyn 7/29 =>> day 5 DVT:  SCD/INR 2.51 - liver injury =>>  INR 1.58 today  Plan:  n-acetylcysteine stopped yesterday, clamp NG, sips of clears after IR has a chance to do procedure.       LOS: 9 days    Gunter Conde 08/07/2016 [phone]

## 2016-08-07 NOTE — Progress Notes (Signed)
Patient's liver chemistries continue to improve, now off acetylcysteine. INR has been below 2 for the past 2 days.  The patient's mental status appears normal, and substantially improved from 2 days ago when I last saw her. She is relaxed, coherent, and appropriate. She does serial threes promptly and accurately. There is no asterixis at all.  Impression: Resolving acute liver failure, most likely as a consequence of acetaminophen toxicity.  Recommendations:  1. We will sign off. Please call us if we can be of further assistance in this patient's management. 2. The patient and her mother were given reassurance that her liver should fully recover and, in fact, she could even use acetaminophen (inappropriate doses) in the future, although the patient currently expresses no interest in doing so. 3. The patient was noted to have some steatosis of the liver on her CT scan. Her baseline liver chemistries were normal prior to surgery, so despite the radiographic evidence of steatosis, it is anticipated that her liver enzymes will eventually come down all the way to normal, as they were prior to surgery. 4. I have made a follow-up appointment for the patient to be seen in our office on Friday, August 31, at 10:45 AM (to see our physician assistant) to hopefully wrap up things with respect to her liver. We do not anticipate that this patient will need ongoing liver management. The patient was advised to contact our office approximately a week prior to that appointment to arrange a lab visit so that the results will be available at the time of her office visit. (She can do without this, if she has liver chemistries obtained elsewhere prior to that visit, for example at the surgeon's office, and if they have normalized by that time).  Florencia Reasonsobert V. Dejohn Ibarra, M.D. Pager [phone] If no answer or after 5 PM call [phone]

## 2016-08-07 NOTE — Progress Notes (Signed)
Interventional Radiology Progress Note  Pt is 41 yo female presenting for CT guided aspiration/drain of pelvic fluid.   Scout CT shows decreasing size of pelvic fluid.  Narrow window for transgluteal access.    Discussed with Will Marlyne BeardsJennings of Sx team.  Given patient is feeling better with appetite and the overall smaller appearance, I feel it is reasonable to defer for now.  If patient deteriorates, may rescan and reconsider.    Discussed with patient, who tells me she is ready to eat and feeling better.   Call with questions/concerns.   Signed,  Yvone NeuJaime S. Loreta AveWagner, DO

## 2016-08-07 NOTE — Progress Notes (Signed)
Initial Nutrition Assessment  DOCUMENTATION CODES:   Not applicable  INTERVENTION:  - Diet advancement as medically feasible. - RD will provide interventions, if warranted, with diet advancement.  NUTRITION DIAGNOSIS:   Inadequate oral intake related to inability to eat as evidenced by NPO status.  GOAL:   Patient will meet greater than or equal to 90% of their needs  MONITOR:   Diet advancement, Weight trends, Labs, Skin  REASON FOR ASSESSMENT:   LOS (day 9)  ASSESSMENT:   41 y/o female with a past history of pancreatitis and gastroparesis presented on 7/24 with belly pain due to perforated appendicitis requiring surgical management.  On post op day 5 she had progressive confusion and elevated LFT's requiring transfer to the ICU.   Pt was using home supply of tylenol while inpatient.    BMI indicates normal weight. Weight -10 lbs (4.4 kg) from admission. Will continue to monitor weight trends closely. Pt had appy for ruptured appendix on 7/25 and subsequently had confusion and elevated LFTs with liver failure 2/2 self-medicating with Tylenol from home. Per notes, planned drainage of peritoneal abscess by IR, NGT to be clamped today, and pt to be permitted sips of clears following these events.   Diet hx since admission: NPO 7/24 @ 2108 CLD 7/25 @ 1447 FLD 7/26 @ 1756  Regular 7/27 @ 0600 FLD 7/28 @ 1407 NPO 7/29 @ 2044 until present  Physical assessment shows no muscle or fat wasting at this time, some mild edema to extremities.   Medications reviewed; 2 g IV Mg sulfate x1 run today, 10 mEq IV KCl x4 runs yesterday, 40 mEq KCl per NGT x1 dose today, 30 mmol IV KPhos x1 run yesterday. Labs reviewed; K: 2.9 mmol/L, BUN: 5 mg/dL, creatinine: 0.4 mg/dL, Ca: 8 mg/dL, Alk Phos elevated but trending down, AST/ALT elevated but trending down.  IVF: D5-NS-40 mEq KCl @ 75 mL/hr (306 kcal from dextrose).    Diet Order:  Diet general Diet NPO time specified Except for: Sips with  Meds, Ice Chips  Skin:  Wound (see comment) (Abdominal incision from appy 7/25)  Last BM:  7/30  Height:   Ht Readings from Last 1 Encounters:  08/03/16 5' 7"  (1.702 m)    Weight:   Wt Readings from Last 1 Encounters:  08/07/16 135 lb 5.8 oz (61.4 kg)    Ideal Body Weight:  61.36 kg  BMI:  Body mass index is 21.2 kg/m.  Estimated Nutritional Needs:   Kcal:  1660-1860 (25-28 kcal/kg)  Protein:  80-90 grams  Fluid:  >/= 2 L/day  EDUCATION NEEDS:   No education needs identified at this time    Jarome Matin, MS, RD, LDN, CNSC Inpatient Clinical Dietitian Pager # [phone] After hours/weekend pager # [phone]

## 2016-08-08 LAB — COMPREHENSIVE METABOLIC PANEL
ALK PHOS: 249 U/L — AB (ref 38–126)
ALT: 856 U/L — ABNORMAL HIGH (ref 14–54)
ANION GAP: 7 (ref 5–15)
AST: 76 U/L — ABNORMAL HIGH (ref 15–41)
Albumin: 3.1 g/dL — ABNORMAL LOW (ref 3.5–5.0)
BILIRUBIN TOTAL: 1.8 mg/dL — AB (ref 0.3–1.2)
BUN: <5 mg/dL — ABNORMAL LOW (ref 6–20)
CALCIUM: 8.1 mg/dL — AB (ref 8.9–10.3)
CO2: 23 mmol/L (ref 22–32)
Chloride: 111 mmol/L (ref 101–111)
Creatinine, Ser: 0.56 mg/dL (ref 0.44–1.00)
GLUCOSE: 166 mg/dL — AB (ref 65–99)
Potassium: 3.9 mmol/L (ref 3.5–5.1)
Sodium: 141 mmol/L (ref 135–145)
TOTAL PROTEIN: 6.3 g/dL — AB (ref 6.5–8.1)

## 2016-08-08 LAB — PROTIME-INR
INR: 1.5
PROTHROMBIN TIME: 18.3 s — AB (ref 11.4–15.2)

## 2016-08-08 LAB — CBC
HCT: 37.3 % (ref 36.0–46.0)
Hemoglobin: 12 g/dL (ref 12.0–15.0)
MCH: 31.2 pg (ref 26.0–34.0)
MCHC: 32.2 g/dL (ref 30.0–36.0)
MCV: 96.9 fL (ref 78.0–100.0)
Platelets: 227 10*3/uL (ref 150–400)
RBC: 3.85 MIL/uL — AB (ref 3.87–5.11)
RDW: 16 % — ABNORMAL HIGH (ref 11.5–15.5)
WBC: 17.4 10*3/uL — AB (ref 4.0–10.5)

## 2016-08-08 LAB — MAGNESIUM: MAGNESIUM: 2 mg/dL (ref 1.7–2.4)

## 2016-08-08 MED ORDER — SIMETHICONE 80 MG PO CHEW
80.0000 mg | CHEWABLE_TABLET | Freq: Four times a day (QID) | ORAL | Status: DC | PRN
  Administered 2016-08-08 – 2016-08-09 (×2): 80 mg via ORAL
  Filled 2016-08-08 (×2): qty 1

## 2016-08-08 NOTE — Progress Notes (Signed)
Pharmacy Antibiotic Note  Valerie AdlerStephanie R Mitchell is a 41 y.o. female admitted on 07/29/2016 with sepsis and perforated appendicitis requiring surgical management on 7/25. Postop course complicated by progressive confusion and elevated LFT's requiring transfer to ICU.  Pharmacy intially consulted for vancomycin and zosyn dosing for peritoneal abscess, vanc stopped on 7/31> continue zosyn  Today, 08/08/2016  Day #6 Zosyn Afebrile WBC 17.4 and increasing SCr 0.56, stable, CrCl ~ 1690mls/min Cultures negative to date  Plan:   Continue Zosyn 3.375gm IV q8h (4hr extended infusions)  Follow up renal function & cultures, de-escalate as appropriate  Height: 5\' 7"  (170.2 cm) Weight: 135 lb 2.3 oz (61.3 kg) IBW/kg (Calculated) : 61.6  Temp (24hrs), Avg:98.6 F (37 C), Min:98.1 F (36.7 C), Max:98.8 F (37.1 C)   Recent Labs Lab 08/03/16 2156  08/04/16 0106 08/04/16 0515 08/05/16 0339 08/06/16 0316 08/07/16 0250 08/07/16 1948 08/08/16 0330  WBC  --   < > 10.5  --  6.8 10.6* 13.6*  --  17.4*  CREATININE  --   < > 0.62  --  0.42* 0.53 0.40* 0.57 0.56  LATICACIDVEN 1.9  --  2.3* 1.7  --   --   --   --   --   < > = values in this interval not displayed.  Estimated Creatinine Clearance: 89.6 mL/min (by C-G formula based on SCr of 0.56 mg/dL).    No Known Allergies  Antimicrobials this admission: 7/24 Zosyn x1; 7/29>> 7/25 Rocephin + Flagyl>>7/27 7/27 Augmentin>>7/29 7/29 vanc 7/29>> 7/31  Dose adjustments:  Microbiology results: 7/24UCx: multiple sp, recollect  7/24 Surgical PCR: +SA, -MRSA 7/29 MRSA PCR: neg 7/29 BCx2: 7/29 Ucx: 7/30 Hepatitis panel:  Thank you for allowing pharmacy to be a part of this patient's care.  Valerie Mitchell RPh 08/08/2016, 8:41 AM Pager [phone]

## 2016-08-08 NOTE — Progress Notes (Signed)
9 Days Post-Op    CC:  Abdominal pain  Subjective: Pt wants to eat, she has had 4 [place], 4 cups of broth, and BM x 3 since yesterday.  No pain on exam.  She feels good and really wants some real food.  Objective: Vital signs in last 24 hours: Temp:  [98.1 F (36.7 C)-98.8 F (37.1 C)] 98.8 F (37.1 C) (08/03 0306) Pulse Rate:  [77-89] 78 (08/03 0500) Resp:  [11-21] 16 (08/03 0500) BP: (104-142)/(62-89) 142/80 (08/03 0500) SpO2:  [97 %-100 %] 98 % (08/03 0500) Weight:  [61.3 kg (135 lb 2.3 oz)] 61.3 kg (135 lb 2.3 oz) (08/03 0500) Last BM Date: 08/04/16 60 PO 1700 IV 200 urine 300 ng Afebrile, VSS Glucose 166 LFT's continue to improve WBC up to 17.4; H/H is up some, platelets OK INR down to 1.5 CT 08/07/16:  Limited CT for planning purposes during aspiration/drainage demonstrates decreasing size of pelvic fluid collection. Drain was deferred at this time Intake/Output from previous day: 08/02 0701 - 08/03 0700 In: 1777.5 [P.O.:60; I.V.:1367.5; IV Piggyback:350] Out: 500 [Urine:200; Emesis/NG output:300] Intake/Output this shift: No intake/output data recorded.  General appearance: alert, cooperative and no distress Resp: clear to auscultation bilaterally GI: soft, non-tender; bowel sounds normal; no masses,  no organomegaly and some ecchymosis, sites all look good.  Lab Results:   Recent Labs  08/07/16 0250 08/08/16 0330  WBC 13.6* 17.4*  HGB 11.0* 12.0  HCT 33.4* 37.3  PLT 197 227    BMET  Recent Labs  08/07/16 1948 08/08/16 0330  NA 142 141  K 3.0* 3.9  CL 108 111  CO2 25 23  GLUCOSE 134* 166*  BUN 5* <5*  CREATININE 0.57 0.56  CALCIUM 8.3* 8.1*   PT/INR  Recent Labs  08/07/16 0250 08/08/16 0330  LABPROT 19.0* 18.3*  INR 1.58 1.50     Recent Labs Lab 08/04/16 0106 08/05/16 0339 08/06/16 0316 08/07/16 0250 08/08/16 0330  AST 1,186* 348* 136* 90* 76*  ALT 3,934* 1,998* 1,371* 992* 856*  ALKPHOS 299* 284* 255* 235* 249*   BILITOT 2.7* 2.5* 2.7* 2.2* 1.8*  PROT 5.2* 4.9* 5.2* 5.5* 6.3*  ALBUMIN 2.7* 2.5* 2.5* 2.7* 3.1*     Lipase     Component Value Date/Time   LIPASE 33 08/03/2016 1626     Medications:   . dextrose 5 % and 0.9 % NaCl with KCl 40 mEq/L 75 mL/hr at 08/08/16 0400  . piperacillin-tazobactam (ZOSYN)  IV 3.375 g (08/08/16 0530)    Assessment/Plan Acute appendicitis with perforation S/p laparoscopic appendectomy 07/30/16, Dr. Magnus IvanBlackman;  POD 7 Post op leak with abscess - CT 8/2 shows decrease in drainage and IR drain deferred for now. Acute liver toxicity probably secondary to Tylenol use Metabolic encephalopathy - improving Hypokalemia K= 2.5  >> 2.9 >> 3.9 today Hx of Gastroparesis Hxo of pancreatitis - possibly related to ETOH Hx of renal insuffiencey Hx of depression FEN: NPO/IV meds/Fluids ID: ceftriaxone/Flagyl 7/24-7/16/18, Augmentin 7/27-7/29,Vancomycin 7/29-7/31/18, Zosyn 7/29 =>>day 6 DVT: SCD/INR 2.51 - liver injury =>>  INR 1.58 today    Plan:  D/c ng, full liquids, WBC still going up.CCM is OK with her leaking stepdown. I will check with Dr. Gerrit FriendsGerkin before making that call,  GI is also signing off.     LOS: 10 days    Charm Stenner 08/08/2016 [phone]

## 2016-08-08 NOTE — Progress Notes (Signed)
Pt  Alert x4, v/s no c/o pain [facility] care. I will continue to monitor.

## 2016-08-09 LAB — COMPREHENSIVE METABOLIC PANEL
ALBUMIN: 2.7 g/dL — AB (ref 3.5–5.0)
ALT: 576 U/L — ABNORMAL HIGH (ref 14–54)
AST: 53 U/L — AB (ref 15–41)
Alkaline Phosphatase: 194 U/L — ABNORMAL HIGH (ref 38–126)
Anion gap: 6 (ref 5–15)
CHLORIDE: 110 mmol/L (ref 101–111)
CO2: 24 mmol/L (ref 22–32)
Calcium: 8.5 mg/dL — ABNORMAL LOW (ref 8.9–10.3)
Creatinine, Ser: 0.53 mg/dL (ref 0.44–1.00)
GFR calc Af Amer: >60 mL/min (ref >60)
GFR calc non Af Amer: >60 mL/min (ref >60)
GLUCOSE: 101 mg/dL — AB (ref 65–99)
POTASSIUM: 5.2 mmol/L — AB (ref 3.5–5.1)
SODIUM: 140 mmol/L (ref 135–145)
Total Bilirubin: 1.5 mg/dL — ABNORMAL HIGH (ref 0.3–1.2)
Total Protein: 5.6 g/dL — ABNORMAL LOW (ref 6.5–8.1)

## 2016-08-09 LAB — CBC
HCT: 34.6 % — ABNORMAL LOW (ref 36.0–46.0)
Hemoglobin: 10.9 g/dL — ABNORMAL LOW (ref 12.0–15.0)
MCH: 30.9 pg (ref 26.0–34.0)
MCHC: 31.5 g/dL (ref 30.0–36.0)
MCV: 98 fL (ref 78.0–100.0)
PLATELETS: 202 10*3/uL (ref 150–400)
RBC: 3.53 MIL/uL — AB (ref 3.87–5.11)
RDW: 16.1 % — AB (ref 11.5–15.5)
WBC: 11.6 10*3/uL — AB (ref 4.0–10.5)

## 2016-08-09 LAB — CULTURE, BLOOD (ROUTINE X 2)
CULTURE: NO GROWTH
CULTURE: NO GROWTH
SPECIAL REQUESTS: ADEQUATE
Special Requests: ADEQUATE

## 2016-08-09 LAB — PROTIME-INR
INR: 1.44
Prothrombin Time: 17.7 seconds — ABNORMAL HIGH (ref 11.4–15.2)

## 2016-08-09 MED ORDER — KCL IN DEXTROSE-NACL 10-5-0.45 MEQ/L-%-% IV SOLN
INTRAVENOUS | Status: DC
  Administered 2016-08-09: 14:00:00 via INTRAVENOUS
  Filled 2016-08-09 (×2): qty 1000

## 2016-08-09 MED ORDER — KCL IN DEXTROSE-NACL 10-5-0.45 MEQ/L-%-% IV SOLN
INTRAVENOUS | Status: DC
  Filled 2016-08-09: qty 1000

## 2016-08-09 NOTE — Progress Notes (Signed)
[facility]:  [phone] General Surgery Progress Note   LOS: 11 days  POD -  10 Days Post-Op  Chief Complaint: Abdominal pain  Assessment and Plan: 1.   APPENDECTOMY LAPAROSCOPIC - 07/30/2016 - Blackman  Leak  WBC - 11,600 - 08/09/2016  Zosyn  2.  Acute liver toxicity probably secondary to Tylenol use  Partner brought meds into patient  3.  Metabolic encephalopathy - resovled 4. Hx of Gastroparesis 5. Hx of pancreatitis - possibly related to ETOH 6.  Hx of depression 7.  Hyperkalemia  K+ - 5.2 - 08/09/2016  To remove most K+ from IVF  8.  DVT prophylaxis - no chemoprophylaxis   Active Problems:   Appendicitis   Acute appendicitis   Change in mental status   Encephalopathy   Pelvic abscess in female   Severe sepsis (HCC)   Subjective:  Doing well.  Minimal abdominal discomfort.  Wants reg food.  Objective:   Vitals:   08/08/16 2020 08/09/16 0539  BP: 130/78 126/83  Pulse: 79 75  Resp: 18 18  Temp: 98.5 F (36.9 C) 97.6 F (36.4 C)     Intake/Output from previous day:  08/03 0701 - 08/04 0700 In: 2165 [P.O.:400; I.V.:1615; IV Piggyback:150] Out: 1000 [Urine:1000]  Intake/Output this shift:  No intake/output data recorded.   Physical Exam:   General: WN WF who is alert and oriented.    HEENT: Normal. Pupils equal. .   Lungs: Clear.   Abdomen: Soft.  Incisions okay.  Has bowel sounds   Lab Results:    Recent Labs  08/08/16 0330 08/09/16 0506  WBC 17.4* 11.6*  HGB 12.0 10.9*  HCT 37.3 34.6*  PLT 227 202    BMET   Recent Labs  08/08/16 0330 08/09/16 0506  NA 141 140  K 3.9 5.2*  CL 111 110  CO2 23 24  GLUCOSE 166* 101*  BUN <5* <5*  CREATININE 0.56 0.53  CALCIUM 8.1* 8.5*    PT/INR   Recent Labs  08/08/16 0330 08/09/16 0506  LABPROT 18.3* 17.7*  INR 1.50 1.44    ABG  No results for input(s): PHART, HCO3 in the last 72 hours.  Invalid input(s): PCO2, PO2   Studies/Results:  Ct Pelvis Limited Wo  Contrast  Result Date: 08/07/2016 CLINICAL DATA:  41 year old female with a history of appendicitis and pelvic fluid collection. She has had acute liver injury during her admission after appendectomy, with leukocytosis, improving. She has been referred for drainage of fluid in the pelvis, with normalization of INR today. EXAM: CT PELVIS WITHOUT CONTRAST TECHNIQUE: Multidetector CT imaging of the pelvis was performed following the standard protocol without intravenous contrast. COMPARISON:  08/04/2016 FINDINGS: Limited prone position CT of the pelvis for planning purposes performed without contrast. Fluid collection in the anatomic pelvis has decreased in size from the comparison CT. Limited trans gluteal window on the right. Greatest diameter measures 4.3 cm with the greatest diameter on the prior 5.7 cm. No additional fluid collection identified. Unremarkable appearance of the visualized urinary bladder, uterus/adnexa, and bowel. IMPRESSION: Limited CT for planning purposes during aspiration/drainage demonstrates decreasing size of pelvic fluid collection. Drain was deferred at this time. Signed, Yvone NeuJaime S. Loreta AveWagner, DO [facility] Electronically Signed   By: Gilmer MorJaime  Wagner D.O.   On: 08/07/2016 13:53     Anti-infectives:   Anti-infectives    Start     Dose/Rate Route Frequency Ordered Stop   08/04/16 1000  vancomycin (  VANCOCIN) IVPB 750 mg/150 ml premix  Status:  Discontinued     750 mg 150 mL/hr over 60 Minutes Intravenous Every 8 hours 08/04/16 0033 08/05/16 1005   08/03/16 2300  piperacillin-tazobactam (ZOSYN) IVPB 3.375 g  Status:  Discontinued     3.375 g 12.5 mL/hr over 240 Minutes Intravenous  Once 08/03/16 2247 08/03/16 2249   08/03/16 2300  vancomycin (VANCOCIN) IVPB 1000 mg/200 mL premix     1,000 mg 200 mL/hr over 60 Minutes Intravenous  Once 08/03/16 2247 08/04/16 0311   08/03/16 1400  piperacillin-tazobactam (ZOSYN) IVPB 3.375 g      3.375 g 12.5 mL/hr over 240 Minutes Intravenous Every 8 hours 08/03/16 1148     08/01/16 1200  amoxicillin-clavulanate (AUGMENTIN) 875-125 MG per tablet 1 tablet  Status:  Discontinued     1 tablet Oral Every 12 hours 08/01/16 1004 08/03/16 1143   07/31/16 0000  amoxicillin-clavulanate (AUGMENTIN) 875-125 MG tablet     1 tablet Oral 2 times daily 07/31/16 0851     07/30/16 0200  metroNIDAZOLE (FLAGYL) IVPB 500 mg  Status:  Discontinued     500 mg 100 mL/hr over 60 Minutes Intravenous Every 8 hours 07/30/16 0003 08/01/16 1004   07/30/16 0100  cefTRIAXone (ROCEPHIN) 2 g in dextrose 5 % 50 mL IVPB  Status:  Discontinued     2 g 100 mL/hr over 30 Minutes Intravenous Every 24 hours 07/30/16 0003 08/01/16 1004   07/29/16 2115  piperacillin-tazobactam (ZOSYN) IVPB 3.375 g     3.375 g 100 mL/hr over 30 Minutes Intravenous  Once 07/29/16 2107 07/29/16 2201      Ovidio Kinavid Domenique Southers, MD, FACS Pager: [phone] [facility]: [phone] 08/09/2016

## 2016-08-10 LAB — CBC WITH DIFFERENTIAL/PLATELET
BASOS ABS: 0 10*3/uL (ref 0.0–0.1)
Basophils Relative: 0 %
Eosinophils Absolute: 0.3 10*3/uL (ref 0.0–0.7)
Eosinophils Relative: 2 %
HEMATOCRIT: 39.3 % (ref 36.0–46.0)
Hemoglobin: 12.9 g/dL (ref 12.0–15.0)
LYMPHS PCT: 18 %
Lymphs Abs: 2.2 10*3/uL (ref 0.7–4.0)
MCH: 31.2 pg (ref 26.0–34.0)
MCHC: 32.8 g/dL (ref 30.0–36.0)
MCV: 95.2 fL (ref 78.0–100.0)
MONO ABS: 0.7 10*3/uL (ref 0.1–1.0)
MONOS PCT: 6 %
NEUTROS ABS: 9.3 10*3/uL — AB (ref 1.7–7.7)
Neutrophils Relative %: 74 %
Platelets: 231 10*3/uL (ref 150–400)
RBC: 4.13 MIL/uL (ref 3.87–5.11)
RDW: 15.7 % — AB (ref 11.5–15.5)
WBC: 12.5 10*3/uL — ABNORMAL HIGH (ref 4.0–10.5)

## 2016-08-10 LAB — OPIATES CONFIRMATION, URINE
CODEINE: NEGATIVE
MORPHINE: POSITIVE — AB
Morphine GC/MS Conf: 481 ng/mL
OPIATES: POSITIVE — AB

## 2016-08-10 LAB — PROTIME-INR
INR: 1.19
Prothrombin Time: 15.2 seconds (ref 11.4–15.2)

## 2016-08-10 LAB — URINE DRUGS OF ABUSE SCREEN W ALC, ROUTINE (REF LAB)
Amphetamines, Urine: NEGATIVE ng/mL
BENZODIAZEPINE QUANT UR: NEGATIVE ng/mL
Barbiturate, Ur: NEGATIVE ng/mL
COCAINE (METAB.): NEGATIVE ng/mL
Cannabinoid Quant, Ur: NEGATIVE ng/mL
Ethanol U, Quan: NEGATIVE %
METHADONE SCREEN, URINE: NEGATIVE ng/mL
PROPOXYPHENE, URINE: NEGATIVE ng/mL
Phencyclidine, Ur: NEGATIVE ng/mL

## 2016-08-10 MED ORDER — AMOXICILLIN-POT CLAVULANATE 875-125 MG PO TABS: 1.0000 | ORAL_TABLET | Freq: Two times a day (BID) | ORAL | 3 refills | Status: DC

## 2016-08-10 NOTE — Discharge Summary (Signed)
Physician Discharge Summary  Patient ID:  Valerie Mitchell  MRN: [id number]  DOB/AGE: 03/08/1975 41 y.o.  Admit date: 07/29/2016 Discharge date: 08/10/2016  Discharge Diagnoses:  1.  Appendicitis  2.  Post op intraabdominal abscess 3.  Acute liver toxicity probably secondary to Tylenol use             Partner brought meds into patient  4.  Metabolic encephalopathy - resovled 5. Hx of Gastroparesis 6. Hx of pancreatitis - possibly related to ETOH 7.  Hx of depression   Active Problems:   Appendicitis   Acute appendicitis   Change in mental status   Encephalopathy   Pelvic abscess in female   Severe sepsis [facility])   Operation: Procedure(s): APPENDECTOMY LAPAROSCOPIC on  07/30/2016 - D. Magnus Ivan  Discharged Condition: good  Hospital Course: Valerie Mitchell is an 41 y.o. female whose primary care physician is Babs Sciara, MD and who was admitted 07/29/2016 with a chief complaint of  Chief Complaint  Patient presents with  . Abdominal Pain  .   She was brought to the operating room on 07/30/2016 and underwent lap appendectomy.   Post op, her course was complicated by her friend bringing in tylenol and her developing an acute liver toxicity secondary to excess tylenol.  There was also an intra-abdominal fluid collection.  CT scan on 08/07/2016 showed that the fluid had mostly resolved.  She has been on Zosyn. She will go home on Augmentin. She is now afebrile, tolerating reg diet, and ready to go home.  The discharge instructions were reviewed with the patient.  Consults: Critical care medicine  Significant Diagnostic Studies: Results for orders placed or performed during the hospital encounter of 07/29/16  Urine culture  Result Value Ref Range   Specimen Description URINE, RANDOM    Special Requests NONE    Culture MULTIPLE SPECIES PRESENT, SUGGEST RECOLLECTION (A)    Report Status 07/31/2016 FINAL   Surgical PCR screen  Result Value Ref Range   MRSA, PCR  NEGATIVE NEGATIVE   Staphylococcus aureus POSITIVE (A) NEGATIVE  MRSA PCR Screening  Result Value Ref Range   MRSA by PCR NEGATIVE NEGATIVE  Culture, blood (routine x 2)  Result Value Ref Range   Specimen Description BLOOD LEFT HAND    Special Requests      BOTTLES DRAWN AEROBIC AND ANAEROBIC Blood Culture adequate volume   Culture      NO GROWTH 5 DAYS Performed at [facility], [address] [place], [place] [address]    Report Status 08/09/2016 FINAL   Culture, blood (routine x 2)  Result Value Ref Range   Specimen Description BLOOD RIGHT HAND    Special Requests      BOTTLES DRAWN AEROBIC ONLY Blood Culture adequate volume   Culture      NO GROWTH 5 DAYS Performed at [facility], [address] [place], [place] [address]    Report Status 08/09/2016 FINAL   Culture, Urine  Result Value Ref Range   Specimen Description URINE, RANDOM    Special Requests NONE    Culture      NO GROWTH Performed at [facility], [address], [place], [place] [address]    Report Status 08/05/2016 FINAL   Urinalysis, Routine w reflex microscopic  Result Value Ref Range   Color, Urine YELLOW YELLOW   APPearance CLOUDY (A) CLEAR   Specific Gravity, Urine 1.017 1.005 - 1.030   pH 6.0 5.0 -  8.0   Glucose, UA NEGATIVE NEGATIVE mg/dL   Hgb urine dipstick NEGATIVE NEGATIVE   Bilirubin Urine MODERATE (A) NEGATIVE   Ketones, ur >80 (A) NEGATIVE mg/dL   Protein, ur NEGATIVE NEGATIVE mg/dL   Nitrite NEGATIVE NEGATIVE   Leukocytes, UA MODERATE (A) NEGATIVE  Pregnancy, urine  Result Value Ref Range   Preg Test, Ur NEGATIVE NEGATIVE  Urinalysis, Microscopic (reflex)  Result Value Ref Range   RBC / HPF 0-5 0 - 5 RBC/hpf   WBC, UA 6-30 0 - 5 WBC/hpf   Bacteria, UA RARE (A) NONE SEEN   Squamous Epithelial / LPF 6-30 (A) NONE SEEN   Mucous PRESENT   Comprehensive metabolic panel  Result Value Ref Range   Sodium 137 135 - 145 mmol/L   Potassium 3.7 3.5 - 5.1 mmol/L    Chloride 103 101 - 111 mmol/L   CO2 19 (L) 22 - 32 mmol/L   Glucose, Bld 81 65 - 99 mg/dL   BUN 10 6 - 20 mg/dL   Creatinine, Ser 1.61 0.44 - 1.00 mg/dL   Calcium 8.5 (L) 8.9 - 10.3 mg/dL   Total Protein 7.2 6.5 - 8.1 g/dL   Albumin 3.9 3.5 - 5.0 g/dL   AST 13 (L) 15 - 41 U/L   ALT 8 (L) 14 - 54 U/L   Alkaline Phosphatase 84 38 - 126 U/L   Total Bilirubin 0.7 0.3 - 1.2 mg/dL   GFR calc non Af Amer >60 >60 mL/min   GFR calc Af Amer >60 >60 mL/min   Anion gap 15 5 - 15  Lipase, blood  Result Value Ref Range   Lipase 16 11 - 51 U/L  CBC with Differential  Result Value Ref Range   WBC 12.3 (H) 4.0 - 10.5 K/uL   RBC 3.73 (L) 3.87 - 5.11 MIL/uL   Hemoglobin 12.0 12.0 - 15.0 g/dL   HCT 09.6 (L) 04.5 - 40.9 %   MCV 96.0 78.0 - 100.0 fL   MCH 32.2 26.0 - 34.0 pg   MCHC 33.5 30.0 - 36.0 g/dL   RDW 81.1 91.4 - 78.2 %   Platelets 294 150 - 400 K/uL   Neutrophils Relative % 88 %   Neutro Abs 10.8 (H) 1.7 - 7.7 K/uL   Lymphocytes Relative 7 %   Lymphs Abs 0.8 0.7 - 4.0 K/uL   Monocytes Relative 5 %   Monocytes Absolute 0.6 0.1 - 1.0 K/uL   Eosinophils Relative 0 %   Eosinophils Absolute 0.0 0.0 - 0.7 K/uL   Basophils Relative 0 %   Basophils Absolute 0.0 0.0 - 0.1 K/uL  HIV antibody (Routine Testing)  Result Value Ref Range   HIV Screen 4th Generation wRfx Non Reactive Non Reactive  CBC  Result Value Ref Range   WBC 10.7 (H) 4.0 - 10.5 K/uL   RBC 3.47 (L) 3.87 - 5.11 MIL/uL   Hemoglobin 10.8 (L) 12.0 - 15.0 g/dL   HCT 95.6 (L) 21.3 - 08.6 %   MCV 94.5 78.0 - 100.0 fL   MCH 31.1 26.0 - 34.0 pg   MCHC 32.9 30.0 - 36.0 g/dL   RDW 57.8 46.9 - 62.9 %   Platelets 285 150 - 400 K/uL  CBC  Result Value Ref Range   WBC 14.5 (H) 4.0 - 10.5 K/uL   RBC 3.59 (L) 3.87 - 5.11 MIL/uL   Hemoglobin 11.2 (L) 12.0 - 15.0 g/dL   HCT 52.8 (L) 41.3 - 24.4 %   MCV  93.6 78.0 - 100.0 fL   MCH 31.2 26.0 - 34.0 pg   MCHC 33.3 30.0 - 36.0 g/dL   RDW 96.0 45.4 - 09.8 %   Platelets 309 150 - 400  K/uL  Basic metabolic panel  Result Value Ref Range   Sodium 139 135 - 145 mmol/L   Potassium 3.3 (L) 3.5 - 5.1 mmol/L   Chloride 110 101 - 111 mmol/L   CO2 20 (L) 22 - 32 mmol/L   Glucose, Bld 102 (H) 65 - 99 mg/dL   BUN 12 6 - 20 mg/dL   Creatinine, Ser 1.19 0.44 - 1.00 mg/dL   Calcium 8.1 (L) 8.9 - 10.3 mg/dL   GFR calc non Af Amer >60 >60 mL/min   GFR calc Af Amer >60 >60 mL/min   Anion gap 9 5 - 15  Basic metabolic panel  Result Value Ref Range   Sodium 142 135 - 145 mmol/L   Potassium 3.5 3.5 - 5.1 mmol/L   Chloride 111 101 - 111 mmol/L   CO2 21 (L) 22 - 32 mmol/L   Glucose, Bld 147 (H) 65 - 99 mg/dL   BUN 16 6 - 20 mg/dL   Creatinine, Ser 1.47 0.44 - 1.00 mg/dL   Calcium 7.9 (L) 8.9 - 10.3 mg/dL   GFR calc non Af Amer >60 >60 mL/min   GFR calc Af Amer >60 >60 mL/min   Anion gap 10 5 - 15  Magnesium  Result Value Ref Range   Magnesium 2.0 1.7 - 2.4 mg/dL  CBC  Result Value Ref Range   WBC 11.6 (H) 4.0 - 10.5 K/uL   RBC 3.56 (L) 3.87 - 5.11 MIL/uL   Hemoglobin 10.9 (L) 12.0 - 15.0 g/dL   HCT 82.9 (L) 56.2 - 13.0 %   MCV 91.3 78.0 - 100.0 fL   MCH 30.6 26.0 - 34.0 pg   MCHC 33.5 30.0 - 36.0 g/dL   RDW 86.5 78.4 - 69.6 %   Platelets 297 150 - 400 K/uL  Glucose, capillary  Result Value Ref Range   Glucose-Capillary 142 (H) 65 - 99 mg/dL   Comment 1 Notify RN   Lipase, blood  Result Value Ref Range   Lipase 33 11 - 51 U/L  Hepatic function panel  Result Value Ref Range   Total Protein 5.2 (L) 6.5 - 8.1 g/dL   Albumin 3.0 (L) 3.5 - 5.0 g/dL   AST 2,952 (H) 15 - 41 U/L   ALT 4,495 (H) 14 - 54 U/L   Alkaline Phosphatase 356 (H) 38 - 126 U/L   Total Bilirubin 3.4 (H) 0.3 - 1.2 mg/dL   Bilirubin, Direct 2.5 (H) 0.1 - 0.5 mg/dL   Indirect Bilirubin 0.9 0.3 - 0.9 mg/dL  Ammonia  Result Value Ref Range   Ammonia 131 (H) 9 - 35 umol/L  Urinalysis, Routine w reflex microscopic  Result Value Ref Range   Color, Urine AMBER (A) YELLOW   APPearance CLEAR CLEAR    Specific Gravity, Urine 1.016 1.005 - 1.030   pH 6.0 5.0 - 8.0   Glucose, UA NEGATIVE NEGATIVE mg/dL   Hgb urine dipstick NEGATIVE NEGATIVE   Bilirubin Urine NEGATIVE NEGATIVE   Ketones, ur NEGATIVE NEGATIVE mg/dL   Protein, ur 30 (A) NEGATIVE mg/dL   Nitrite NEGATIVE NEGATIVE   Leukocytes, UA NEGATIVE NEGATIVE   RBC / HPF 0-5 0 - 5 RBC/hpf   WBC, UA 0-5 0 - 5 WBC/hpf   Bacteria, UA RARE (A) NONE  SEEN   Squamous Epithelial / LPF NONE SEEN NONE SEEN   Mucous PRESENT   Blood gas, arterial  Result Value Ref Range   FIO2 21.00    Delivery systems ROOM AIR    pH, Arterial 7.439 7.350 - 7.450   pCO2 arterial 34.2 32.0 - 48.0 mmHg   pO2, Arterial 73.0 (L) 83.0 - 108.0 mmHg   Bicarbonate 22.8 20.0 - 28.0 mmol/L   Acid-base deficit 0.5 0.0 - 2.0 mmol/L   O2 Saturation 94.6 %   Patient temperature 98.5    Collection site LEFT RADIAL    Drawn by [id number]    Sample type ARTERIAL    Allens test (pass/fail) PASS PASS  Urine rapid drug screen (hosp performed)  Result Value Ref Range   Opiates POSITIVE (A) NONE DETECTED   Cocaine NONE DETECTED NONE DETECTED   Benzodiazepines POSITIVE (A) NONE DETECTED   Amphetamines NONE DETECTED NONE DETECTED   Tetrahydrocannabinol NONE DETECTED NONE DETECTED   Barbiturates NONE DETECTED NONE DETECTED  Glucose, capillary  Result Value Ref Range   Glucose-Capillary 159 (H) 65 - 99 mg/dL  Protime-INR  Result Value Ref Range   Prothrombin Time 34.1 (H) 11.4 - 15.2 seconds   INR 3.28   Acetaminophen level  Result Value Ref Range   Acetaminophen (Tylenol), Serum 13 10 - 30 ug/mL  Lactic acid, plasma  Result Value Ref Range   Lactic Acid, Venous 1.9 0.5 - 1.9 mmol/L  Lactic acid, plasma  Result Value Ref Range   Lactic Acid, Venous 2.3 (HH) 0.5 - 1.9 mmol/L  Amylase  Result Value Ref Range   Amylase 10 (L) 28 - 100 U/L  Gamma GT  Result Value Ref Range   GGT 398 (H) 7 - 50 U/L  CBC  Result Value Ref Range   WBC 10.6 (H) 4.0 - 10.5 K/uL   RBC  3.43 (L) 3.87 - 5.11 MIL/uL   Hemoglobin 10.6 (L) 12.0 - 15.0 g/dL   HCT 04.5 (L) 40.9 - 81.1 %   MCV 91.3 78.0 - 100.0 fL   MCH 30.9 26.0 - 34.0 pg   MCHC 33.9 30.0 - 36.0 g/dL   RDW 91.4 78.2 - 95.6 %   Platelets 270 150 - 400 K/uL  Creatinine, serum  Result Value Ref Range   Creatinine, Ser 0.53 0.44 - 1.00 mg/dL   GFR calc non Af Amer >60 >60 mL/min   GFR calc Af Amer >60 >60 mL/min  CBC  Result Value Ref Range   WBC 10.5 4.0 - 10.5 K/uL   RBC 3.65 (L) 3.87 - 5.11 MIL/uL   Hemoglobin 11.1 (L) 12.0 - 15.0 g/dL   HCT 21.3 (L) 08.6 - 57.8 %   MCV 90.1 78.0 - 100.0 fL   MCH 30.4 26.0 - 34.0 pg   MCHC 33.7 30.0 - 36.0 g/dL   RDW 46.9 62.9 - 52.8 %   Platelets 269 150 - 400 K/uL  Basic metabolic panel  Result Value Ref Range   Sodium 142 135 - 145 mmol/L   Potassium 3.2 (L) 3.5 - 5.1 mmol/L   Chloride 111 101 - 111 mmol/L   CO2 22 22 - 32 mmol/L   Glucose, Bld 184 (H) 65 - 99 mg/dL   BUN 11 6 - 20 mg/dL   Creatinine, Ser 4.13 0.44 - 1.00 mg/dL   Calcium 7.6 (L) 8.9 - 10.3 mg/dL   GFR calc non Af Amer >60 >60 mL/min   GFR calc Af Amer >60 >60  mL/min   Anion gap 9 5 - 15  Magnesium  Result Value Ref Range   Magnesium 1.8 1.7 - 2.4 mg/dL  Phosphorus  Result Value Ref Range   Phosphorus <1.0 (LL) 2.5 - 4.6 mg/dL  Hepatic function panel  Result Value Ref Range   Total Protein 5.2 (L) 6.5 - 8.1 g/dL   Albumin 2.7 (L) 3.5 - 5.0 g/dL   AST 1,610 (H) 15 - 41 U/L   ALT 3,934 (H) 14 - 54 U/L   Alkaline Phosphatase 299 (H) 38 - 126 U/L   Total Bilirubin 2.7 (H) 0.3 - 1.2 mg/dL   Bilirubin, Direct 1.9 (H) 0.1 - 0.5 mg/dL   Indirect Bilirubin 0.8 0.3 - 0.9 mg/dL  Lactic acid, plasma  Result Value Ref Range   Lactic Acid, Venous 1.7 0.5 - 1.9 mmol/L  CK  Result Value Ref Range   Total CK 26 (L) 38 - 234 U/L  Protime-INR  Result Value Ref Range   Prothrombin Time 30.4 (H) 11.4 - 15.2 seconds   INR 2.84   Hepatitis panel, acute  Result Value Ref Range   Hepatitis B  Surface Ag Negative Negative   HCV Ab <0.1 0.0 - 0.9 s/co ratio   Hep A IgM Negative Negative   Hep B C IgM Negative Negative  Acetaminophen level  Result Value Ref Range   Acetaminophen (Tylenol), Serum <10 (L) 10 - 30 ug/mL  Basic metabolic panel  Result Value Ref Range   Sodium 143 135 - 145 mmol/L   Potassium 2.5 (LL) 3.5 - 5.1 mmol/L   Chloride 108 101 - 111 mmol/L   CO2 26 22 - 32 mmol/L   Glucose, Bld 182 (H) 65 - 99 mg/dL   BUN <5 (L) 6 - 20 mg/dL   Creatinine, Ser 9.60 (L) 0.44 - 1.00 mg/dL   Calcium 7.2 (L) 8.9 - 10.3 mg/dL   GFR calc non Af Amer >60 >60 mL/min   GFR calc Af Amer >60 >60 mL/min   Anion gap 9 5 - 15  Magnesium  Result Value Ref Range   Magnesium 1.6 (L) 1.7 - 2.4 mg/dL  Phosphorus  Result Value Ref Range   Phosphorus 1.5 (L) 2.5 - 4.6 mg/dL  CBC  Result Value Ref Range   WBC 6.8 4.0 - 10.5 K/uL   RBC 3.41 (L) 3.87 - 5.11 MIL/uL   Hemoglobin 10.6 (L) 12.0 - 15.0 g/dL   HCT 45.4 (L) 09.8 - 11.9 %   MCV 89.7 78.0 - 100.0 fL   MCH 31.1 26.0 - 34.0 pg   MCHC 34.6 30.0 - 36.0 g/dL   RDW 14.7 82.9 - 56.2 %   Platelets 247 150 - 400 K/uL  Hepatic function panel  Result Value Ref Range   Total Protein 4.9 (L) 6.5 - 8.1 g/dL   Albumin 2.5 (L) 3.5 - 5.0 g/dL   AST 130 (H) 15 - 41 U/L   ALT 1,998 (H) 14 - 54 U/L   Alkaline Phosphatase 284 (H) 38 - 126 U/L   Total Bilirubin 2.5 (H) 0.3 - 1.2 mg/dL   Bilirubin, Direct 1.3 (H) 0.1 - 0.5 mg/dL   Indirect Bilirubin 1.2 (H) 0.3 - 0.9 mg/dL  Acetaminophen level  Result Value Ref Range   Acetaminophen (Tylenol), Serum <10 (L) 10 - 30 ug/mL  Protime-INR  Result Value Ref Range   Prothrombin Time 27.6 (H) 11.4 - 15.2 seconds   INR 2.51   Ammonia  Result Value Ref Range  Ammonia 118 (H) 9 - 35 umol/L  Hepatitis panel, acute  Result Value Ref Range   Hepatitis B Surface Ag Negative Negative   HCV Ab <0.1 0.0 - 0.9 s/co ratio   Hep A IgM Negative Negative   Hep B C IgM Negative Negative  ANA, IFA (with  reflex)  Result Value Ref Range   ANA Ab, IFA Negative   Magnesium  Result Value Ref Range   Magnesium 1.6 (L) 1.7 - 2.4 mg/dL  CBC  Result Value Ref Range   WBC 10.6 (H) 4.0 - 10.5 K/uL   RBC 3.75 (L) 3.87 - 5.11 MIL/uL   Hemoglobin 11.7 (L) 12.0 - 15.0 g/dL   HCT 62.134.1 (L) 30.836.0 - 65.746.0 %   MCV 90.9 78.0 - 100.0 fL   MCH 31.2 26.0 - 34.0 pg   MCHC 34.3 30.0 - 36.0 g/dL   RDW 84.615.0 96.211.5 - 95.215.5 %   Platelets 258 150 - 400 K/uL  Comprehensive metabolic panel  Result Value Ref Range   Sodium 142 135 - 145 mmol/L   Potassium 2.6 (LL) 3.5 - 5.1 mmol/L   Chloride 104 101 - 111 mmol/L   CO2 29 22 - 32 mmol/L   Glucose, Bld 138 (H) 65 - 99 mg/dL   BUN <5 (L) 6 - 20 mg/dL   Creatinine, Ser 8.410.53 0.44 - 1.00 mg/dL   Calcium 7.7 (L) 8.9 - 10.3 mg/dL   Total Protein 5.2 (L) 6.5 - 8.1 g/dL   Albumin 2.5 (L) 3.5 - 5.0 g/dL   AST 324136 (H) 15 - 41 U/L   ALT 1,371 (H) 14 - 54 U/L   Alkaline Phosphatase 255 (H) 38 - 126 U/L   Total Bilirubin 2.7 (H) 0.3 - 1.2 mg/dL   GFR calc non Af Amer >60 >60 mL/min   GFR calc Af Amer >60 >60 mL/min   Anion gap 9 5 - 15  Protime-INR  Result Value Ref Range   Prothrombin Time 22.2 (H) 11.4 - 15.2 seconds   INR 1.92   Magnesium  Result Value Ref Range   Magnesium 2.0 1.7 - 2.4 mg/dL  Phosphorus  Result Value Ref Range   Phosphorus 2.2 (L) 2.5 - 4.6 mg/dL  Protime-INR  Result Value Ref Range   Prothrombin Time 19.0 (H) 11.4 - 15.2 seconds   INR 1.58   CBC  Result Value Ref Range   WBC 13.6 (H) 4.0 - 10.5 K/uL   RBC 3.55 (L) 3.87 - 5.11 MIL/uL   Hemoglobin 11.0 (L) 12.0 - 15.0 g/dL   HCT 40.133.4 (L) 02.736.0 - 25.346.0 %   MCV 94.1 78.0 - 100.0 fL   MCH 31.0 26.0 - 34.0 pg   MCHC 32.9 30.0 - 36.0 g/dL   RDW 66.415.3 40.311.5 - 47.415.5 %   Platelets 197 150 - 400 K/uL  Comprehensive metabolic panel  Result Value Ref Range   Sodium 140 135 - 145 mmol/L   Potassium 2.9 (L) 3.5 - 5.1 mmol/L   Chloride 106 101 - 111 mmol/L   CO2 26 22 - 32 mmol/L   Glucose, Bld 88 65  - 99 mg/dL   BUN 5 (L) 6 - 20 mg/dL   Creatinine, Ser 2.590.40 (L) 0.44 - 1.00 mg/dL   Calcium 8.0 (L) 8.9 - 10.3 mg/dL   Total Protein 5.5 (L) 6.5 - 8.1 g/dL   Albumin 2.7 (L) 3.5 - 5.0 g/dL   AST 90 (H) 15 - 41 U/L   ALT 992 (H)  14 - 54 U/L   Alkaline Phosphatase 235 (H) 38 - 126 U/L   Total Bilirubin 2.2 (H) 0.3 - 1.2 mg/dL   GFR calc non Af Amer >60 >60 mL/min   GFR calc Af Amer >60 >60 mL/min   Anion gap 8 5 - 15  BMET today at 2000  Result Value Ref Range   Sodium 142 135 - 145 mmol/L   Potassium 3.0 (L) 3.5 - 5.1 mmol/L   Chloride 108 101 - 111 mmol/L   CO2 25 22 - 32 mmol/L   Glucose, Bld 134 (H) 65 - 99 mg/dL   BUN 5 (L) 6 - 20 mg/dL   Creatinine, Ser 1.61 0.44 - 1.00 mg/dL   Calcium 8.3 (L) 8.9 - 10.3 mg/dL   GFR calc non Af Amer >60 >60 mL/min   GFR calc Af Amer >60 >60 mL/min   Anion gap 9 5 - 15  Protime-INR  Result Value Ref Range   Prothrombin Time 18.3 (H) 11.4 - 15.2 seconds   INR 1.50   Magnesium  Result Value Ref Range   Magnesium 2.0 1.7 - 2.4 mg/dL  CBC  Result Value Ref Range   WBC 17.4 (H) 4.0 - 10.5 K/uL   RBC 3.85 (L) 3.87 - 5.11 MIL/uL   Hemoglobin 12.0 12.0 - 15.0 g/dL   HCT 09.6 04.5 - 40.9 %   MCV 96.9 78.0 - 100.0 fL   MCH 31.2 26.0 - 34.0 pg   MCHC 32.2 30.0 - 36.0 g/dL   RDW 81.1 (H) 91.4 - 78.2 %   Platelets 227 150 - 400 K/uL  Comprehensive metabolic panel  Result Value Ref Range   Sodium 141 135 - 145 mmol/L   Potassium 3.9 3.5 - 5.1 mmol/L   Chloride 111 101 - 111 mmol/L   CO2 23 22 - 32 mmol/L   Glucose, Bld 166 (H) 65 - 99 mg/dL   BUN <5 (L) 6 - 20 mg/dL   Creatinine, Ser 9.56 0.44 - 1.00 mg/dL   Calcium 8.1 (L) 8.9 - 10.3 mg/dL   Total Protein 6.3 (L) 6.5 - 8.1 g/dL   Albumin 3.1 (L) 3.5 - 5.0 g/dL   AST 76 (H) 15 - 41 U/L   ALT 856 (H) 14 - 54 U/L   Alkaline Phosphatase 249 (H) 38 - 126 U/L   Total Bilirubin 1.8 (H) 0.3 - 1.2 mg/dL   GFR calc non Af Amer >60 >60 mL/min   GFR calc Af Amer >60 >60 mL/min   Anion gap 7 5 -  15  Protime-INR  Result Value Ref Range   Prothrombin Time 17.7 (H) 11.4 - 15.2 seconds   INR 1.44   CBC  Result Value Ref Range   WBC 11.6 (H) 4.0 - 10.5 K/uL   RBC 3.53 (L) 3.87 - 5.11 MIL/uL   Hemoglobin 10.9 (L) 12.0 - 15.0 g/dL   HCT 21.3 (L) 08.6 - 57.8 %   MCV 98.0 78.0 - 100.0 fL   MCH 30.9 26.0 - 34.0 pg   MCHC 31.5 30.0 - 36.0 g/dL   RDW 46.9 (H) 62.9 - 52.8 %   Platelets 202 150 - 400 K/uL  Comprehensive metabolic panel  Result Value Ref Range   Sodium 140 135 - 145 mmol/L   Potassium 5.2 (H) 3.5 - 5.1 mmol/L   Chloride 110 101 - 111 mmol/L   CO2 24 22 - 32 mmol/L   Glucose, Bld 101 (H) 65 - 99 mg/dL  BUN <5 (L) 6 - 20 mg/dL   Creatinine, Ser 1.61 0.44 - 1.00 mg/dL   Calcium 8.5 (L) 8.9 - 10.3 mg/dL   Total Protein 5.6 (L) 6.5 - 8.1 g/dL   Albumin 2.7 (L) 3.5 - 5.0 g/dL   AST 53 (H) 15 - 41 U/L   ALT 576 (H) 14 - 54 U/L   Alkaline Phosphatase 194 (H) 38 - 126 U/L   Total Bilirubin 1.5 (H) 0.3 - 1.2 mg/dL   GFR calc non Af Amer >60 >60 mL/min   GFR calc Af Amer >60 >60 mL/min   Anion gap 6 5 - 15  Protime-INR  Result Value Ref Range   Prothrombin Time 15.2 11.4 - 15.2 seconds   INR 1.19   CBC with Differential/Platelet  Result Value Ref Range   WBC 12.5 (H) 4.0 - 10.5 K/uL   RBC 4.13 3.87 - 5.11 MIL/uL   Hemoglobin 12.9 12.0 - 15.0 g/dL   HCT 09.6 04.5 - 40.9 %   MCV 95.2 78.0 - 100.0 fL   MCH 31.2 26.0 - 34.0 pg   MCHC 32.8 30.0 - 36.0 g/dL   RDW 81.1 (H) 91.4 - 78.2 %   Platelets 231 150 - 400 K/uL   Neutrophils Relative % 74 %   Neutro Abs 9.3 (H) 1.7 - 7.7 K/uL   Lymphocytes Relative 18 %   Lymphs Abs 2.2 0.7 - 4.0 K/uL   Monocytes Relative 6 %   Monocytes Absolute 0.7 0.1 - 1.0 K/uL   Eosinophils Relative 2 %   Eosinophils Absolute 0.3 0.0 - 0.7 K/uL   Basophils Relative 0 %   Basophils Absolute 0.0 0.0 - 0.1 K/uL  Basic metabolic panel  Result Value Ref Range   Sodium 137 135 - 145 mmol/L   Potassium 3.7 3.5 - 5.1 mmol/L   Chloride 104  101 - 111 mmol/L   CO2 23 22 - 32 mmol/L   Glucose, Bld 128 (H) 65 - 99 mg/dL   BUN <5 (L) 6 - 20 mg/dL   Creatinine, Ser 9.56 0.44 - 1.00 mg/dL   Calcium 8.8 (L) 8.9 - 10.3 mg/dL   GFR calc non Af Amer >60 >60 mL/min   GFR calc Af Amer >60 >60 mL/min   Anion gap 10 5 - 15  I-Stat CG4 Lactic Acid, ED  Result Value Ref Range   Lactic Acid, Venous 1.82 0.5 - 1.9 mmol/L    Dg Abd 1 View  Result Date: 08/03/2016 CLINICAL DATA:  NG tube placement EXAM: ABDOMEN - 1 VIEW COMPARISON:  08/02/2016 FINDINGS: Enteric tube terminates in the distal gastric body. Cholecystectomy clips. Contrast in the right colon. IMPRESSION: Enteric tube terminates in the distal gastric body. Electronically Signed   By: Charline Bills M.D.   On: 08/03/2016 22:40   Ct Head Wo Contrast  Result Date: 08/03/2016 CLINICAL DATA:  Altered mental status. EXAM: CT HEAD WITHOUT CONTRAST TECHNIQUE: Contiguous axial images were obtained from the base of the skull through the vertex without intravenous contrast. COMPARISON:  03/19/2010 FINDINGS: Brain: There is no evidence of acute infarct, intracranial hemorrhage, mass, midline shift, or extra-axial fluid collection. The ventricles and sulci are normal. Vascular: No hyperdense vessel. Skull: No fracture focal osseous lesion. Sinuses/Orbits: No significant inflammatory disease in the included paranasal sinuses are mastoid air cells. Unremarkable orbits. Other: None. IMPRESSION: Unremarkable head CT. Electronically Signed   By: Sebastian Ache M.D.   On: 08/03/2016 12:40   Ct Abdomen Pelvis W Contrast  Result Date: 08/04/2016 CLINICAL DATA:  41 year old female with recent appendectomy with abdominal pain. EXAM: CT ABDOMEN AND PELVIS WITH CONTRAST TECHNIQUE: Multidetector CT imaging of the abdomen and pelvis was performed using the standard protocol following bolus administration of intravenous contrast. CONTRAST:  ISOVUE-300 IOPAMIDOL (ISOVUE-300) INJECTION 61% COMPARISON:   Abdominal CT dated 07/29/2016 FINDINGS: Lower chest: Partially visualized small bilateral pleural effusions with associated partial compressive atelectasis of the lower lobes. Pneumonia is not excluded. Clinical correlation is recommended. No intra-abdominal free air.  Small free fluid within the pelvis. Hepatobiliary: Apparent mild fatty infiltration of the liver. Cholecystectomy. There is mild dilatation of the central CBD, likely post cholecystectomy. No retained calcified stone noted within the CBD. Small amount of fluid in the in the subhepatic area and within the cholecystectomy bed. Pancreas: Unremarkable. No pancreatic ductal dilatation or surrounding inflammatory changes. Spleen: Normal in size without focal abnormality. Adrenals/Urinary Tract: The adrenal glands are unremarkable. There are small nonobstructing bilateral renal calculi as seen on the prior CT. No hydronephrosis. Left renal hypodense lesion is too small to characterize, likely a cyst. The visualized ureters appear unremarkable. There is partial distention of the urinary bladder despite presence of a Foley catheter. Small amount of air is also seen within the urinary bladder, likely introduced via Foley. Stomach/Bowel: An enteric tube is seen with tip in the distal stomach. There is no evidence of bowel obstruction. There is mild inflammatory changes and thickening of the distal small bowel loops, likely reactive. There is postsurgical changes of appendectomy. There is a small amount of loculated fluid adjacent to the appendix to appendectomy clip and extending into the pelvis. There is a 5.7 x 3.0 x 2.8 cm loculated fluid with enhancing wall in the cul-de-sac most consistent with an infected fluid collection or developing abscess. There is an 11 x 10 x 32 mm fluid collection noted in the lower abdomen in the midline (series 2, image 61). Vascular/Lymphatic: No significant vascular findings are present. No enlarged abdominal or pelvic lymph  nodes. Reproductive: Uterus and bilateral adnexa are unremarkable. Other: There is mild diffuse subcutaneous edema. There is stranding of the periumbilical subcutaneous fat with a small pocket of air, likely related to laparoscopic port. No fluid collection. Musculoskeletal: No acute or significant osseous findings. IMPRESSION: 1. Status post recent appendectomy with small loculated fluid adjacent to the appendectomy clip as well as a loculated fluid in the cul-de-sac most consistent with developing abscesses. Suture dehiscence and leakage from the appendectomy stump is not excluded. 2. Prior cholecystectomy. Small amount of fluid in the cholecystectomy bed, likely extension of the fluid from the pelvis. Body leak as the cause of the pelvic abscess is much less likely. 3. Thickened and inflamed loops of distal small bowel, likely reactive. No bowel obstruction. 4. Fatty liver. 5. Nonobstructing bilateral renal calculi. 6. Partially visualized bilateral pleural effusions and bilateral lower lobe partial compressive atelectasis. Pneumonia is not excluded. Clinical correlation is recommended. Electronically Signed   By: Elgie Collard M.D.   On: 08/04/2016 03:30   Ct Abdomen Pelvis W Contrast  Result Date: 07/29/2016 CLINICAL DATA:  Lower abdomen and pelvic pain for 3 days with nausea EXAM: CT ABDOMEN AND PELVIS WITH CONTRAST TECHNIQUE: Multidetector CT imaging of the abdomen and pelvis was performed using the standard protocol following bolus administration of intravenous contrast. CONTRAST:  ISOVUE-300 IOPAMIDOL (ISOVUE-300) INJECTION 61% COMPARISON:  01/29/2010, CT 06/01/2009 FINDINGS: Lower chest: Lung bases demonstrate no acute consolidation or pleural effusion. Normal heart size. Hepatobiliary:  No focal hepatic abnormality. Surgical clips in the gallbladder fossa. No biliary dilatation Pancreas: Unremarkable. No pancreatic ductal dilatation or surrounding inflammatory changes. Spleen: Normal in size  without focal abnormality. Adrenals/Urinary Tract: Adrenal glands are within normal limits. Subcentimeter hypodensities in the kidneys too small to further characterize. Multiple punctate stones in the right kidney. Multiple small stones in the left kidney, largest is seen in the upper pole and measures 3 mm. Bladder negative. Stomach/Bowel: Stomach within normal limits. No dilated small bowel. Significant right lower quadrant inflammation. Markedly dilated appendix, measuring up to 2 cm with hyperdensity in the proximal lumen measuring 2 cm, possibly a faintly calcified stone. No extraluminal gas collection. Vascular/Lymphatic: Non aneurysmal aorta. No significantly enlarged lymph nodes. Reproductive: Uterus and bilateral adnexa are unremarkable. 15 mm cyst left adnexa Other: No free air.  Small amount of free fluid in the pelvis Musculoskeletal: No acute or significant osseous findings. IMPRESSION: 1. Markedly enlarged appendix up to 2 cm with oval intraluminal density measuring up to 2 cm suspicious for an appendicolith. Significant surrounding inflammatory changes in the right lower quadrant and the findings are consistent with an acute appendicitis. No extraluminal gas collections at this time. No evidence for a pelvic abscess. 2. Multiple nonobstructing stones within the bilateral kidneys. Electronically Signed   By: Jasmine Pang M.D.   On: 07/29/2016 20:46   Ct Pelvis Limited Wo Contrast  Result Date: 08/07/2016 CLINICAL DATA:  41 year old female with a history of appendicitis and pelvic fluid collection. She has had acute liver injury during her admission after appendectomy, with leukocytosis, improving. She has been referred for drainage of fluid in the pelvis, with normalization of INR today. EXAM: CT PELVIS WITHOUT CONTRAST TECHNIQUE: Multidetector CT imaging of the pelvis was performed following the standard protocol without intravenous contrast. COMPARISON:  08/04/2016 FINDINGS: Limited prone  position CT of the pelvis for planning purposes performed without contrast. Fluid collection in the anatomic pelvis has decreased in size from the comparison CT. Limited trans gluteal window on the right. Greatest diameter measures 4.3 cm with the greatest diameter on the prior 5.7 cm. No additional fluid collection identified. Unremarkable appearance of the visualized urinary bladder, uterus/adnexa, and bowel. IMPRESSION: Limited CT for planning purposes during aspiration/drainage demonstrates decreasing size of pelvic fluid collection. Drain was deferred at this time. Signed, Yvone Neu. Loreta Ave, DO [facility] Electronically Signed   By: Gilmer Mor D.O.   On: 08/07/2016 13:53   Dg Abd 2 Views  Result Date: 08/02/2016 CLINICAL DATA:  Nausea, vomiting this morning EXAM: ABDOMEN - 2 VIEW COMPARISON:  CT 07/29/2016 FINDINGS: Oral contrast material noted within the colon. Nonobstructive bowel gas pattern. No free air organomegaly. IMPRESSION: No acute findings. Electronically Signed   By: Charlett Nose M.D.   On: 08/02/2016 14:40    Discharge Exam:  Vitals:   08/09/16 2100 08/10/16 0445  BP: 128/72 113/74  Pulse: 80 84  Resp: 18 18  Temp: 99.1 F (37.3 C) 98.6 F (37 C)    General: WN WF who is alert and generally healthy appearing.  Lungs: Clear to auscultation and symmetric breath sounds. Heart:  RRR. No murmur or rub. Abdomen: Soft. No mass. No tenderness. No hernia. Normal bowel sounds. Incisions look good.  Discharge Medications:   Allergies as of 08/10/2016   No Known Allergies     Medication List    TAKE these medications   amoxicillin-clavulanate 875-125 MG tablet Commonly known as:  AUGMENTIN Take 1  tablet by mouth 2 (two) times daily.   amoxicillin-clavulanate 875-125 MG tablet Commonly known as:  AUGMENTIN Take 1 tablet by mouth 2 (two) times daily.   oxyCODONE 5 MG immediate release tablet Commonly known as:   Oxy IR/ROXICODONE Take 1-2 tablets (5-10 mg total) by mouth every 4 (four) hours as needed for moderate pain or severe pain.       Disposition: 01-Home or Self Care  Discharge Instructions    Call MD for:    Complete by:  As directed    Temperature >101   Call MD for:  hives    Complete by:  As directed    Call MD for:  persistant dizziness or light-headedness    Complete by:  As directed    Call MD for:  persistant nausea and vomiting    Complete by:  As directed    Call MD for:  redness, tenderness, or signs of infection (pain, swelling, redness, odor or green/yellow discharge around incision site)    Complete by:  As directed    Call MD for:  severe uncontrolled pain    Complete by:  As directed    Diet - low sodium heart healthy    Complete by:  As directed    Diet general    Complete by:  As directed    Discharge instructions    Complete by:  As directed    See [facility] discharge instructions   Increase activity slowly    Complete by:  As directed    Increase activity slowly    Complete by:  As directed       Follow-up Information    [facility] Go [facility]2016.   Specialty:  General Surgery Why:  Your appointment is 08/14/16 at 1:30 pm. Please arrive at least 30 min before your appointment to complete your check in paperwork. Contact information: [address] [place] [id number] [phone]           Signed: Ovidio Kinavid Mckennah Kretchmer, M.D., [facility] Office:  [phone]  08/10/2016, 7:38 AM

## 2016-08-10 NOTE — Progress Notes (Signed)
Assessment unchanged. Pt verbalized understanding of dc instrucitons through teach back regarding meds to resume, activity to resume, follow up care and when to call the doctor. Dr. Ezzard StandingNewman gave med scripts to pt on rounds. IV 's x 2 removed. Discharged via foot per pt request accompanied by friend and NT to front entrance.

## 2016-08-10 NOTE — Progress Notes (Signed)
[facility] [facility] Office:  [phone] General Surgery Progress Note   LOS: 12 days  POD -  11 Days Post-Op  Chief Complaint: Abdominal pain  Assessment and Plan: 1.   APPENDECTOMY LAPAROSCOPIC - 07/30/2016 - Magnus IvanBlackman  Leak/post op abscess  WBC - 12,500 - 08/10/2016  Zosyn  Tolerated reg diet - ready to go home  2.  Acute liver toxicity probably secondary to Tylenol use  Partner brought meds into patient  3.  Metabolic encephalopathy - resovled 4. Hx of Gastroparesis 5. Hx of pancreatitis - possibly related to ETOH 6.  Hx of depression 7.  Hyperkalemia  K+ - 3.7 - 08/10/2016  8.  DVT prophylaxis - no chemoprophylaxis   Active Problems:   Appendicitis   Acute appendicitis   Change in mental status   Encephalopathy   Pelvic abscess in female   Severe sepsis (HCC)   Subjective:  Doing well.  Tolerated reg food.  Ready for home.  Objective:   Vitals:   08/09/16 2100 08/10/16 0445  BP: 128/72 113/74  Pulse: 80 84  Resp: 18 18  Temp: 99.1 F (37.3 C) 98.6 F (37 C)     Intake/Output from previous day:  08/04 0701 - 08/05 0700 In: 1550 [P.O.:840; I.V.:560; IV Piggyback:150] Out: 2250 [Urine:2250]  Intake/Output this shift:  No intake/output data recorded.   Physical Exam:   General: WN WF who is alert and oriented.    HEENT: Normal. Pupils equal. .   Lungs: Clear.   Abdomen: Soft.  Incisions okay.  Has bowel sounds   Lab Results:     Recent Labs  08/09/16 0506 08/10/16 0436  WBC 11.6* 12.5*  HGB 10.9* 12.9  HCT 34.6* 39.3  PLT 202 231    BMET    Recent Labs  08/09/16 0506 08/10/16 0436  NA 140 137  K 5.2* 3.7  CL 110 104  CO2 24 23  GLUCOSE 101* 128*  BUN <5* <5*  CREATININE 0.53 0.56  CALCIUM 8.5* 8.8*    PT/INR    Recent Labs  08/09/16 0506 08/10/16 0436  LABPROT 17.7* 15.2  INR 1.44 1.19    ABG  No results for input(s): PHART, HCO3 in the last 72 hours.  Invalid input(s): PCO2, PO2   Studies/Results:  No  results found.   Anti-infectives:   Anti-infectives    Start     Dose/Rate Route Frequency Ordered Stop   08/04/16 1000  vancomycin (VANCOCIN) IVPB 750 mg/150 ml premix  Status:  Discontinued     750 mg 150 mL/hr over 60 Minutes Intravenous Every 8 hours 08/04/16 0033 08/05/16 1005   08/03/16 2300  piperacillin-tazobactam (ZOSYN) IVPB 3.375 g  Status:  Discontinued     3.375 g 12.5 mL/hr over 240 Minutes Intravenous  Once 08/03/16 2247 08/03/16 2249   08/03/16 2300  vancomycin (VANCOCIN) IVPB 1000 mg/200 mL premix     1,000 mg 200 mL/hr over 60 Minutes Intravenous  Once 08/03/16 2247 08/04/16 0311   08/03/16 1400  piperacillin-tazobactam (ZOSYN) IVPB 3.375 g     3.375 g 12.5 mL/hr over 240 Minutes Intravenous Every 8 hours 08/03/16 1148     08/01/16 1200  amoxicillin-clavulanate (AUGMENTIN) 875-125 MG per tablet 1 tablet  Status:  Discontinued     1 tablet Oral Every 12 hours 08/01/16 1004 08/03/16 1143   07/31/16 0000  amoxicillin-clavulanate (AUGMENTIN) 875-125 MG tablet     1 tablet Oral 2 times daily 07/31/16 0851     07/30/16 0200  metroNIDAZOLE (FLAGYL) IVPB 500 mg  Status:  Discontinued     500 mg 100 mL/hr over 60 Minutes Intravenous Every 8 hours 07/30/16 0003 08/01/16 1004   07/30/16 0100  cefTRIAXone (ROCEPHIN) 2 g in dextrose 5 % 50 mL IVPB  Status:  Discontinued     2 g 100 mL/hr over 30 Minutes Intravenous Every 24 hours 07/30/16 0003 08/01/16 1004   07/29/16 2115  piperacillin-tazobactam (ZOSYN) IVPB 3.375 g     3.375 g 100 mL/hr over 30 Minutes Intravenous  Once 07/29/16 2107 07/29/16 2201      Ovidio Kinavid Karrie Fluellen, MD, FACS Pager: [phone] [facility] Office: [phone] 08/10/2016

## 2016-08-11 LAB — BASIC METABOLIC PANEL
ANION GAP: 10 (ref 5–15)
BUN: <5 mg/dL — ABNORMAL LOW (ref 6–20)
CALCIUM: 8.8 mg/dL — AB (ref 8.9–10.3)
CO2: 23 mmol/L (ref 22–32)
Chloride: 104 mmol/L (ref 101–111)
Creatinine, Ser: 0.56 mg/dL (ref 0.44–1.00)
GFR calc Af Amer: >60 mL/min (ref >60)
GFR calc non Af Amer: >60 mL/min (ref >60)
GLUCOSE: 128 mg/dL — AB (ref 65–99)
Potassium: 3.7 mmol/L (ref 3.5–5.1)
Sodium: 137 mmol/L (ref 135–145)

## 2016-08-12 ENCOUNTER — Emergency Department (HOSPITAL_BASED_OUTPATIENT_CLINIC_OR_DEPARTMENT_OTHER): Payer: Self-pay

## 2016-08-12 ENCOUNTER — Emergency Department (HOSPITAL_BASED_OUTPATIENT_CLINIC_OR_DEPARTMENT_OTHER)
Admission: EM | Admit: 2016-08-12 | Discharge: 2016-08-13 | Disposition: A | Discharge status: Home / Self Care | Payer: Self-pay | Attending: Emergency Medicine | Admitting: Emergency Medicine

## 2016-08-12 ENCOUNTER — Encounter (HOSPITAL_BASED_OUTPATIENT_CLINIC_OR_DEPARTMENT_OTHER): Payer: Self-pay

## 2016-08-12 DIAGNOSIS — N289 Disorder of kidney and ureter, unspecified: Secondary | ICD-10-CM | POA: Insufficient documentation

## 2016-08-12 DIAGNOSIS — R1011 Right upper quadrant pain: Secondary | ICD-10-CM | POA: Insufficient documentation

## 2016-08-12 DIAGNOSIS — M549 Dorsalgia, unspecified: Secondary | ICD-10-CM | POA: Insufficient documentation

## 2016-08-12 DIAGNOSIS — Z87891 Personal history of nicotine dependence: Secondary | ICD-10-CM | POA: Insufficient documentation

## 2016-08-12 LAB — URINALYSIS, ROUTINE W REFLEX MICROSCOPIC
BILIRUBIN URINE: NEGATIVE
GLUCOSE, UA: NEGATIVE mg/dL
Hgb urine dipstick: NEGATIVE
KETONES UR: NEGATIVE mg/dL
LEUKOCYTES UA: NEGATIVE
NITRITE: NEGATIVE
PROTEIN: NEGATIVE mg/dL
Specific Gravity, Urine: 1.01 (ref 1.005–1.030)
pH: 7 (ref 5.0–8.0)

## 2016-08-12 LAB — CBC WITH DIFFERENTIAL/PLATELET
Basophils Absolute: 0 10*3/uL (ref 0.0–0.1)
Basophils Relative: 0 %
EOS PCT: 1 %
Eosinophils Absolute: 0.1 10*3/uL (ref 0.0–0.7)
HCT: 36.7 % (ref 36.0–46.0)
Hemoglobin: 11.8 g/dL — ABNORMAL LOW (ref 12.0–15.0)
LYMPHS ABS: 2.2 10*3/uL (ref 0.7–4.0)
Lymphocytes Relative: 23 %
MCH: 31.6 pg (ref 26.0–34.0)
MCHC: 32.2 g/dL (ref 30.0–36.0)
MCV: 98.4 fL (ref 78.0–100.0)
MONO ABS: 1.4 10*3/uL — AB (ref 0.1–1.0)
Monocytes Relative: 14 %
NEUTROS ABS: 6 10*3/uL (ref 1.7–7.7)
Neutrophils Relative %: 62 %
PLATELETS: 316 10*3/uL (ref 150–400)
RBC: 3.73 MIL/uL — AB (ref 3.87–5.11)
RDW: 15.8 % — AB (ref 11.5–15.5)
WBC: 9.7 10*3/uL (ref 4.0–10.5)

## 2016-08-12 LAB — I-STAT CG4 LACTIC ACID, ED
Lactic Acid, Venous: 3.95 mmol/L (ref 0.5–1.9)
Lactic Acid, Venous: 1.19 mmol/L (ref 0.5–1.9)

## 2016-08-12 LAB — COMPREHENSIVE METABOLIC PANEL
ALT: 258 U/L — ABNORMAL HIGH (ref 14–54)
ANION GAP: 10 (ref 5–15)
AST: 53 U/L — AB (ref 15–41)
Albumin: 3.6 g/dL (ref 3.5–5.0)
Alkaline Phosphatase: 194 U/L — ABNORMAL HIGH (ref 38–126)
BILIRUBIN TOTAL: 1.3 mg/dL — AB (ref 0.3–1.2)
BUN: 6 mg/dL (ref 6–20)
CALCIUM: 8.8 mg/dL — AB (ref 8.9–10.3)
CO2: 26 mmol/L (ref 22–32)
CREATININE: 0.68 mg/dL (ref 0.44–1.00)
Chloride: 103 mmol/L (ref 101–111)
GFR calc non Af Amer: >60 mL/min (ref >60)
GLUCOSE: 119 mg/dL — AB (ref 65–99)
Potassium: 4 mmol/L (ref 3.5–5.1)
Sodium: 139 mmol/L (ref 135–145)
TOTAL PROTEIN: 7.4 g/dL (ref 6.5–8.1)

## 2016-08-12 LAB — PREGNANCY, URINE: Preg Test, Ur: NEGATIVE

## 2016-08-12 MED ORDER — DIPHENHYDRAMINE HCL 50 MG/ML IJ SOLN
INTRAMUSCULAR | Status: AC
  Filled 2016-08-12: qty 1

## 2016-08-12 MED ORDER — SODIUM CHLORIDE 0.9 % IV BOLUS (SEPSIS)
1000.0000 mL | Freq: Once | INTRAVENOUS | Status: AC
Start: 2016-08-12 — End: 2016-08-13
  Administered 2016-08-12: 1000 mL via INTRAVENOUS

## 2016-08-12 MED ORDER — VANCOMYCIN HCL IN DEXTROSE 750-5 MG/150ML-% IV SOLN
750.0000 mg | Freq: Two times a day (BID) | INTRAVENOUS | Status: DC
  Filled 2016-08-12: qty 150

## 2016-08-12 MED ORDER — PIPERACILLIN-TAZOBACTAM 3.375 G IVPB
3.3750 g | Freq: Once | INTRAVENOUS | Status: AC
  Administered 2016-08-12: 3.375 g via INTRAVENOUS

## 2016-08-12 MED ORDER — DIPHENHYDRAMINE HCL 50 MG/ML IJ SOLN
25.0000 mg | Freq: Once | INTRAMUSCULAR | Status: AC
  Administered 2016-08-12: 12.5 mg via INTRAVENOUS

## 2016-08-12 MED ORDER — VANCOMYCIN HCL IN DEXTROSE 1-5 GM/200ML-% IV SOLN
1000.0000 mg | Freq: Once | INTRAVENOUS | Status: AC
  Administered 2016-08-12: 1000 mg via INTRAVENOUS
  Filled 2016-08-12: qty 200

## 2016-08-12 MED ORDER — PIPERACILLIN-TAZOBACTAM 3.375 G IVPB: 3.3750 g | Freq: Three times a day (TID) | INTRAVENOUS | Status: DC

## 2016-08-12 MED ORDER — SODIUM CHLORIDE 0.9 % IV BOLUS (SEPSIS)
1000.0000 mL | Freq: Once | INTRAVENOUS | Status: AC
  Administered 2016-08-12: 1000 mL via INTRAVENOUS

## 2016-08-12 MED ORDER — IOPAMIDOL (ISOVUE-300) INJECTION 61%
100.0000 mL | Freq: Once | INTRAVENOUS | Status: AC | PRN
  Administered 2016-08-12: 100 mL via INTRAVENOUS

## 2016-08-12 MED ORDER — PIPERACILLIN-TAZOBACTAM 3.375 G IVPB
INTRAVENOUS | Status: DC
Start: 2016-08-12 — End: 2016-08-13
  Filled 2016-08-12: qty 50

## 2016-08-12 NOTE — Progress Notes (Signed)
Pharmacy Antibiotic Note  Shaaron AdlerStephanie R Furniss is a 41 y.o. female admitted on 08/12/2016 with sepsis.  Pharmacy has been consulted for vancomycin and zosyn dosing. Pt is afebrile. WBC and SCr are WNL. Lactic acid is elevated at 3.95.   Plan: Vanc 1gm IV x 1 then 750mg  IV Q12H Zosyn 3.375gm IV Q8H (4 hr inf) F/u renal fxn, C&S, clinical status and trough at SS  Height: 5\' 7"  (170.2 cm) Weight: 132 lb (59.9 kg) IBW/kg (Calculated) : 61.6  Temp (24hrs), Avg:99.2 F (37.3 C), Min:99.2 F (37.3 C), Max:99.2 F (37.3 C)   Recent Labs Lab 08/07/16 0250 08/07/16 1948 08/08/16 0330 08/09/16 0506 08/10/16 0436 08/12/16 1949 08/12/16 2016  WBC 13.6*  --  17.4* 11.6* 12.5* 9.7  --   CREATININE 0.40* 0.57 0.56 0.53 0.56 0.68  --   LATICACIDVEN  --   --   --   --   --   --  3.95*    Estimated Creatinine Clearance: 87.5 mL/min (by C-G formula based on SCr of 0.68 mg/dL).    No Known Allergies  Antimicrobials this admission: Vanc 8/7>> Zosyn 8/7>>  Dose adjustments this admission: N/A  Microbiology results: Pending  Thank you for allowing pharmacy to be a part of this patient's care.  Eddi Hymes, Drake LeachRachel Lynn 08/12/2016 8:48 PM

## 2016-08-12 NOTE — ED Notes (Signed)
ED Provider at bedside. 

## 2016-08-12 NOTE — ED Triage Notes (Signed)
C/o right abd/flank pain x all day-recent admn at [facility]-d/c 2 days ago-NAD-steady gait

## 2016-08-12 NOTE — ED Provider Notes (Signed)
[facility] Provider Note   CSN: [id number] Arrival date & time: 08/12/16  4098  By signing my name below, I, [name], attest that this documentation has been prepared under the direction and in the presence of [name], MD. Electronically Signed: [name], ED Scribe. 08/12/16. 8:17 PM.  History   Chief Complaint Chief Complaint  Patient presents with  . Abdominal Pain   The history is provided by the patient. No language interpreter was used.   HPI Comments: Valerie Mitchell is a 41 y.o. female who presents to the Emergency Department complaining of 8/10, gradual onset right flank pain onset 2 days ago. Pt states her pain started as RUQ pain and back pain, but now it has come together. Pt describes pain as a stabbing pain. Pt was recently hospitalized for 2 weeks at [facility] long for a ruptured appendix with some complications. Pt states pain is better with her sports bra band across it.  No change in pain with eating for drinking. Pt reports associated dysuria from catheter use which is unchanged.  Pt denies nausea, vomiting, fever, diarrhea, cough or any other complaints at this time.  Past Medical History:  Diagnosis Date  . Depression   . Pancreatitis 11/2008   likely r/t ETOH  . Pyelonephritis 2010  . Renal insufficiency     Patient Active Problem List   Diagnosis Date Noted  . Pelvic abscess in female 08/04/2016  . Severe sepsis (HCC) 08/04/2016  . Encephalopathy 08/03/2016  . Change in mental status   . Acute appendicitis 07/30/2016  . Appendicitis 07/29/2016  . Gastroparesis 03/27/2010  . Chronic epigastric pain 03/27/2010  . IBS (irritable bowel syndrome) 03/27/2010  . Chronic diarrhea 03/27/2010  . Normocytic anemia 03/27/2010  . Transaminitis 03/27/2010  . Elevated serum creatinine 03/27/2010  . HEMATOCHEZIA 02/12/2010  . Abdominal pain 02/12/2010    Past Surgical History:  Procedure Laterality Date  . APPENDECTOMY      . EGD, Dr. Ronni Rumble [facility], bile-stained gastric mucosa  03/08/10  . EUS, Dr. Sonda Primes chronic pancreatitis, duodenal erosion, gb adenomyotosis  01/10/09  . LAPAROSCOPIC APPENDECTOMY N/A 07/30/2016   Procedure: APPENDECTOMY LAPAROSCOPIC;  Surgeon: Abigail Miyamoto, MD;  Location: [facility] ORS;  Service: General;  Laterality: N/A;  . LAPAROSCOPIC CHOLECYSTECTOMY  11/11  . TCS, Dr. Elmer Ramp colon and TI  02/14/10    OB History    No data available       Home Medications    Prior to Admission medications   Medication Sig Start Date End Date Taking? Authorizing Provider  amoxicillin-clavulanate (AUGMENTIN) 875-125 MG tablet Take 1 tablet by mouth 2 (two) times daily. 08/10/16   [name], MD    Family History Family History  Problem Relation Age of Onset  . Colon cancer Maternal Grandfather   . Stroke Mother     Social History Social History  Substance Use Topics  . Smoking status: Former Smoker    Packs/day: 0.80    Years: 20.00    Types: Cigarettes    Quit date: 07/29/2013  . Smokeless tobacco: Never Used  . Alcohol use No     Allergies   Patient has no known allergies.   Review of Systems Review of Systems  Constitutional: Negative for fever.  HENT: Negative for sore throat.   Eyes: Negative for visual disturbance.  Respiratory: Negative for cough and shortness of breath.   Cardiovascular: Negative for chest pain.  Gastrointestinal: Positive for abdominal pain. Negative for diarrhea, nausea and  vomiting.  Genitourinary: Positive for dysuria and flank pain. Negative for difficulty urinating.  Musculoskeletal: Positive for back pain. Negative for neck pain.  Skin: Negative for rash.  Neurological: Negative for syncope and headaches.     Physical Exam Updated Vital Signs BP 124/79   Pulse 93   Temp 99.2 F (37.3 C) (Oral)   Resp 16   Ht 5\' 7"  (1.702 m)   Wt 59.9 kg (132 lb)   LMP 07/22/2016   SpO2 100%   BMI 20.67 kg/m   Physical Exam   Constitutional: She is oriented to person, place, and time. She appears well-developed and well-nourished. No distress.  HENT:  Head: Normocephalic and atraumatic.  Eyes: EOM are normal.  Neck: Normal range of motion.  Cardiovascular: Normal rate, regular rhythm and normal heart sounds.   Pulmonary/Chest: Effort normal and breath sounds normal.  Abdominal: Soft. She exhibits no distension. There is tenderness.  Incisions are clean, dry and intact. RUQ, right flank and right CVA tenderness.   Musculoskeletal: Normal range of motion.  Neurological: She is alert and oriented to person, place, and time.  Skin: Skin is warm and dry.  Psychiatric: She has a normal mood and affect. Judgment normal.  Nursing note and vitals reviewed.    ED Treatments / Results  DIAGNOSTIC STUDIES: Oxygen Saturation is 100% on RA, normal by my interpretation.   COORDINATION OF CARE: 8:15 PM-Discussed next steps with pt. Pt verbalized understanding and is agreeable with the plan.   Labs (all labs ordered are listed, but only abnormal results are displayed) Labs Reviewed  CBC WITH DIFFERENTIAL/PLATELET - Abnormal; Notable for the following:       Result Value   RBC 3.73 (*)    Hemoglobin 11.8 (*)    RDW 15.8 (*)    Monocytes Absolute 1.4 (*)    All other components within normal limits  COMPREHENSIVE METABOLIC PANEL - Abnormal; Notable for the following:    Glucose, Bld 119 (*)    Calcium 8.8 (*)    AST 53 (*)    ALT 258 (*)    Alkaline Phosphatase 194 (*)    Total Bilirubin 1.3 (*)    All other components within normal limits  I-STAT CG4 LACTIC ACID, ED - Abnormal; Notable for the following:    Lactic Acid, Venous 3.95 (*)    All other components within normal limits  CULTURE, BLOOD (ROUTINE X 2)  CULTURE, BLOOD (ROUTINE X 2)  URINALYSIS, ROUTINE W REFLEX MICROSCOPIC  PREGNANCY, URINE  I-STAT CG4 LACTIC ACID, ED  I-STAT CG4 LACTIC ACID, ED    EKG  EKG Interpretation None        Radiology Ct Abdomen Pelvis W Contrast  Result Date: 08/12/2016 CLINICAL DATA:  Right flank pain onset 2 days ago, starting is right upper quadrant and back pain. Recent history of ruptured appendix. EXAM: CT ABDOMEN AND PELVIS WITH CONTRAST TECHNIQUE: Multidetector CT imaging of the abdomen and pelvis was performed using the standard protocol following bolus administration of intravenous contrast. CONTRAST:  ISOVUE-300 IOPAMIDOL (ISOVUE-300) INJECTION 61% COMPARISON:  CT from 08/04/2016 and 08/07/2016 FINDINGS: Lower chest: Bibasilar atelectasis. Normal sized heart without pericardial effusion. Hepatobiliary: Status post cholecystectomy. No choledocholithiasis. No enhancing mass or fluid collections within the liver. No biliary dilatation. Pancreas: Normal Spleen: Small splenule at the hilum.  No splenomegaly or focal mass. Adrenals/Urinary Tract: Normal bilateral adrenal glands. Punctate bilateral renal calculi without obstructive uropathy. Stable cyst in the upper pole left kidney measuring up to  9 mm. No ureteral stones. The urinary bladder is free of calculi, wall thickening and focal mass. Stomach/Bowel: Status post appendectomy without recurrence of inflammation or fluid adjacent to the base of the cecum. No bowel obstruction is identified. Once again there is a small fluid collection in the cul-de-sac, smaller in appearance than on prior currently estimated 4.2 x 2 x 1.2 cm versus 5.7 x 3 x 2.8 cm on 08/04/2016. The stomach is distended physiologically with food. There is normal small bowel rotation. Generalized mild diffuse small bowel thickening of jejunum may reflect stigmata of an enteritis. Vascular/Lymphatic: No significant vascular findings are present. No enlarged abdominal or pelvic lymph nodes. Reproductive: Uterus and bilateral adnexa are unremarkable. Other: No abdominal wall hernia or abnormality. No abdominopelvic ascites. Musculoskeletal: No acute or significant osseous findings.  IMPRESSION: 1. Bilateral nonobstructing renal calculi. 2. Mild diffuse thickened appearance of small bowel loops in the left hemiabdomen question enteritis. No bowel obstruction is seen. 3. Status post appendectomy with smaller residual enhancing cul-de-sac fluid now estimated at 4.2 x 2 x 1.2 cm versus 5.7 x 3 x 2.8 cm on prior comparison. 4. Status post cholecystectomy without biliary obstruction. Electronically Signed   By: Tollie Ethavid  Kwon M.D.   On: 08/12/2016 21:39    Procedures Procedures (including critical care time)  Medications Ordered in ED Medications  piperacillin-tazobactam (ZOSYN) IVPB 3.375 g (not administered)  vancomycin (VANCOCIN) IVPB 750 mg/150 ml premix (not administered)  diphenhydrAMINE (BENADRYL) 50 MG/ML injection (not administered)  sodium chloride 0.9 % bolus 1,000 mL (0 mLs Intravenous Stopped 08/12/16 2158)  sodium chloride 0.9 % bolus 1,000 mL (0 mLs Intravenous Stopped 08/13/16 0019)  piperacillin-tazobactam (ZOSYN) IVPB 3.375 g (0 g Intravenous Stopped 08/12/16 2103)  vancomycin (VANCOCIN) IVPB 1000 mg/200 mL premix (0 mg Intravenous Stopped 08/12/16 2236)  iopamidol (ISOVUE-300) 61 % injection 100 mL (100 mLs Intravenous Contrast Given 08/12/16 2102)  diphenhydrAMINE (BENADRYL) injection 25 mg (12.5 mg Intravenous Given 08/12/16 2157)     Initial Impression / Assessment and Plan / ED Course  I have reviewed the triage vital signs and the nursing notes.  Pertinent labs & imaging results that were available during my care of the patient were reviewed by me and considered in my medical decision making (see chart for details).     41 year old female with recent admission with perforated appendicitis and surgery on 7/25 with Dr. Magnus IvanBlackman, complicated by postoperative abscess on antibiotics, presents with concern for worsening abdominal pain. Patient was discharged days ago, and over last 2 days has developed increasing right-sided abdominal pain. CT shows decreased size and  abscess from most recent CT. Patient has no leukocytosis. No sign of urinary tract infection. CT also shows signs of enteritis. Discussed with abscess and signs of enteritis as well as patient's elevated lactic acid of 3.95 with surgeon on call Dr. Derrell Lollingamirez.  Doubt mesenteric ischemia in the setting of no risk factors.  Given decreasing size of abscess, no leukocytosis, hemodynamic stability, Dr. Derrell Lollingamirez recommends follow up as scheduled in 2 days.  Patient reports symptoms improved in the ED.  Pt was given vanc/zosyn on arrival when lactic acid elevated with concern for possible increasing abscess, however CT shows improvement. Repeat lactic acid after IV fluids decreased to normal. Suspect elevation secondary to dehydration. Patient discharged in stable condition with understanding of reasons to return.   Final Clinical Impressions(s) / ED Diagnoses   Final diagnoses:  Right upper quadrant abdominal pain    New Prescriptions Discharge Medication List  as of 08/13/2016 12:05 AM     I personally performed the services described in this documentation, which was scribed in my presence. The recorded information has been reviewed and is accurate.     [name], MD 08/13/16 [phone]

## 2016-08-18 LAB — CULTURE, BLOOD (ROUTINE X 2)
CULTURE: NO GROWTH
Culture: NO GROWTH
SPECIAL REQUESTS: ADEQUATE
Special Requests: ADEQUATE

## 2018-05-26 IMAGING — CT CT ABD-PELV W/ CM
2 of 5 series · 15 of 46 positions shown, 17 images · IV contrast (APPLIED)
Comparison: CT from 08/04/2016 and 08/07/2016

CLINICAL DATA: Right flank pain onset 2 days ago, starting is right
upper quadrant and back pain. Recent history of ruptured appendix.

EXAM:
CT ABDOMEN AND PELVIS WITH CONTRAST
TECHNIQUE: Multidetector CT imaging of the abdomen and pelvis was performed
using the standard protocol following bolus administration of
intravenous contrast.
CONTRAST:  100mL 36XY9B-4NN IOPAMIDOL (36XY9B-4NN) INJECTION 61%

[Series 2: axial st · axial · 0.72mm/px · z∈[+806,+1252]mm · 12 of 101 slices shown, 14 images]
[im 6/101  soft-tissue]
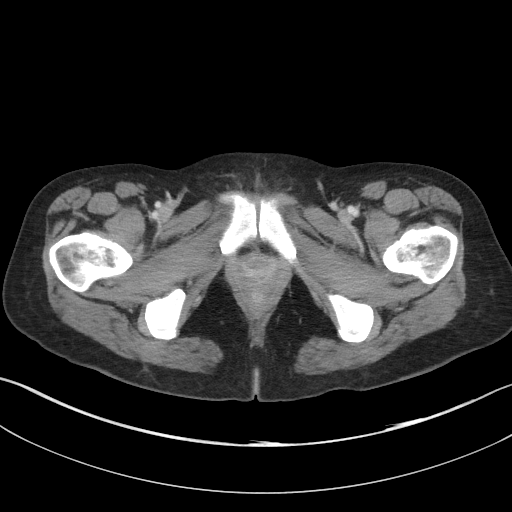
[im 6/101  bone]
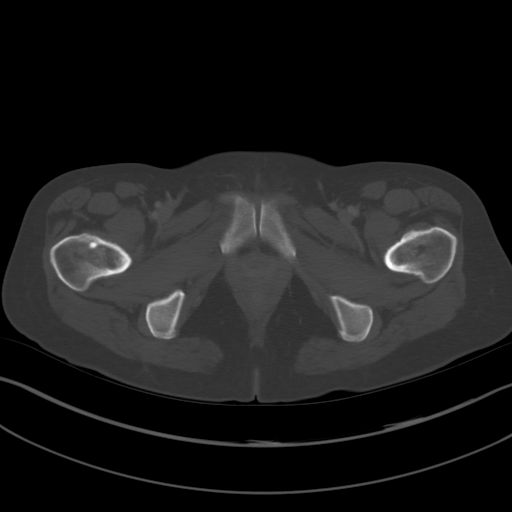
[im 17/101  soft-tissue]
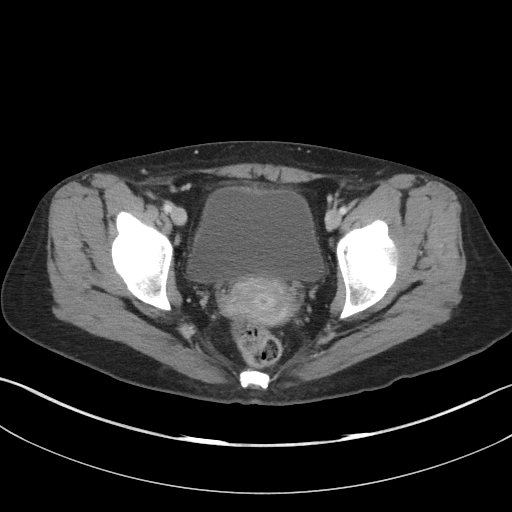
[im 23/101  soft-tissue]
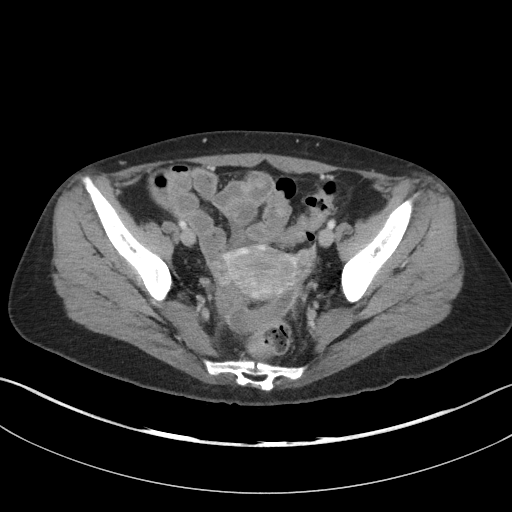
[im 28/101  soft-tissue]
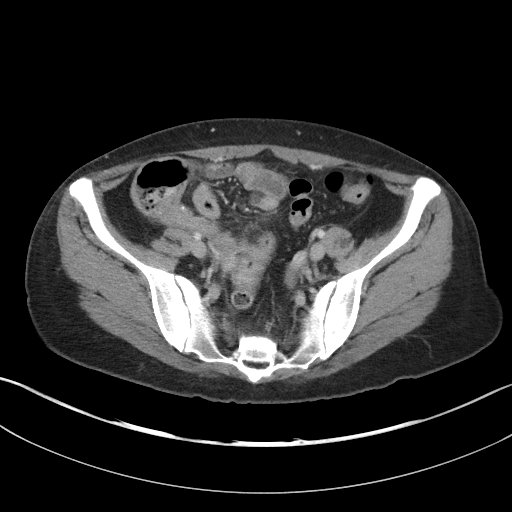
[im 39/101  soft-tissue]
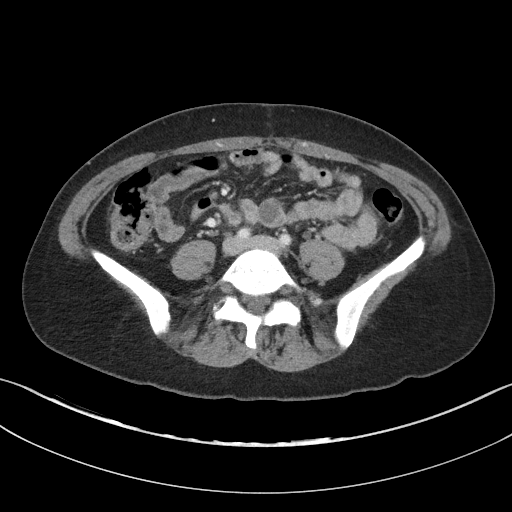
[im 45/101  soft-tissue]
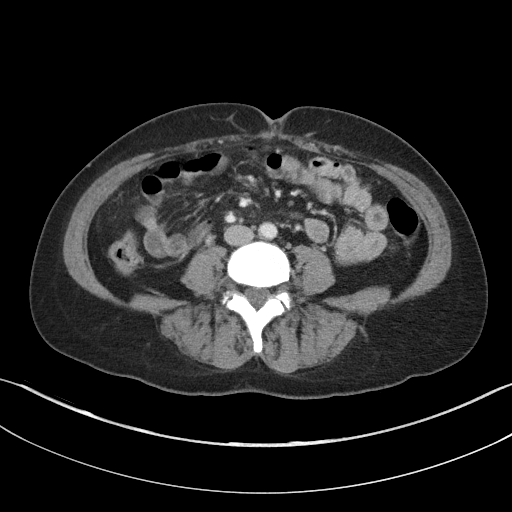
[im 56/101  soft-tissue]
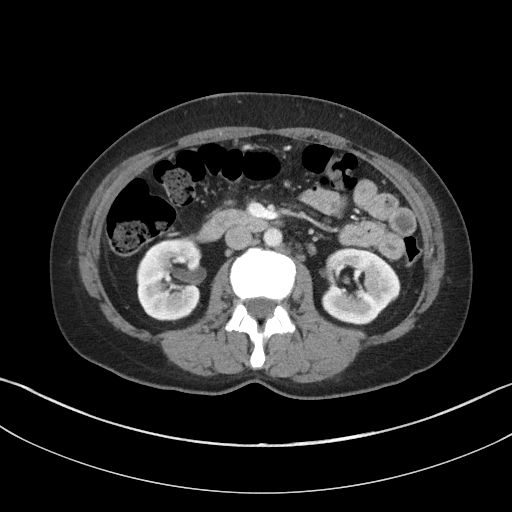
[im 62/101  soft-tissue]
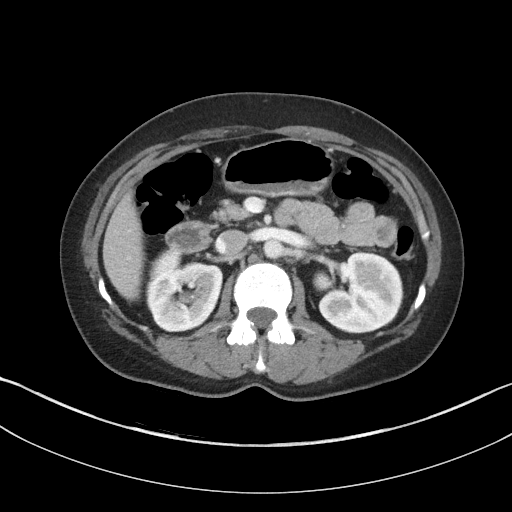
[im 73/101  soft-tissue]
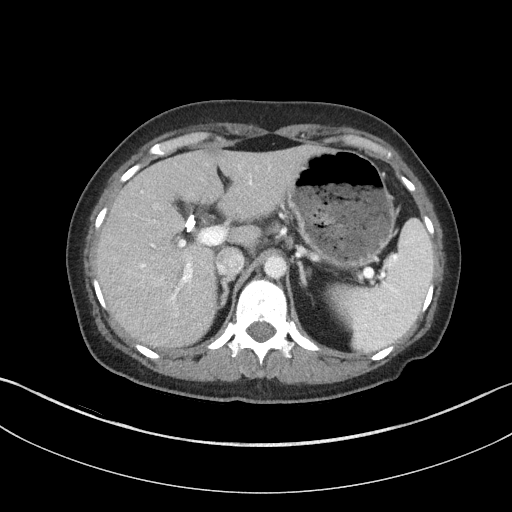
[im 73/101  bone]
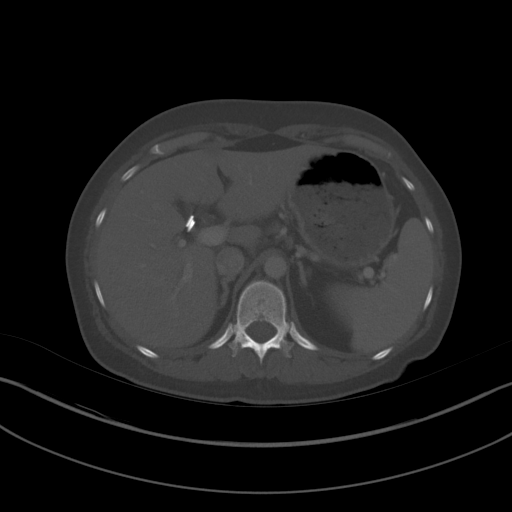
[im 78/101  soft-tissue]
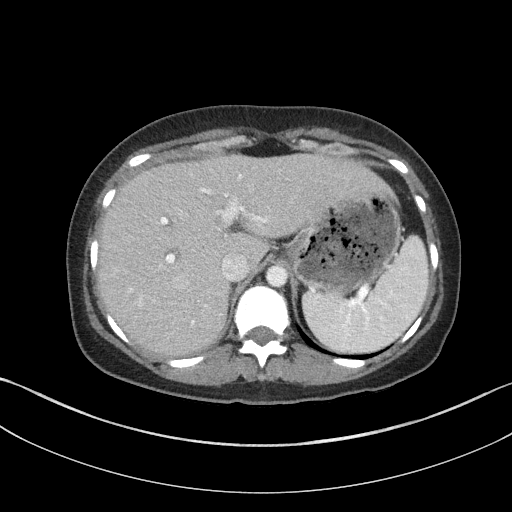
[im 84/101  soft-tissue]
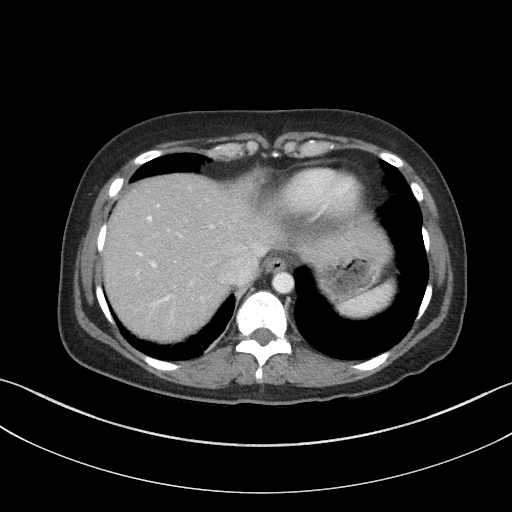
[im 95/101  soft-tissue]
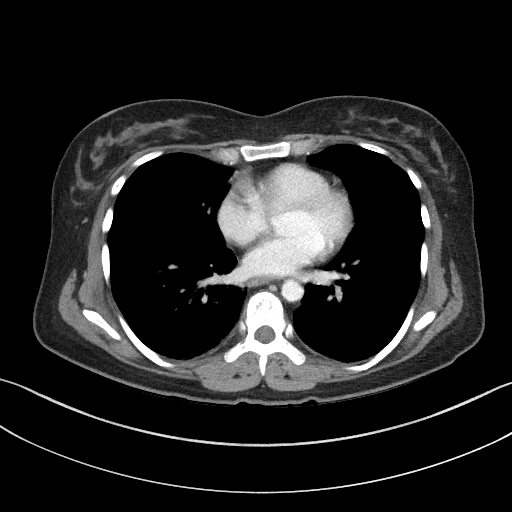

[Series 5: coronal st · coronal · 0.75mm/px · 3 of 80 slices shown]
[im 27/80  soft-tissue]
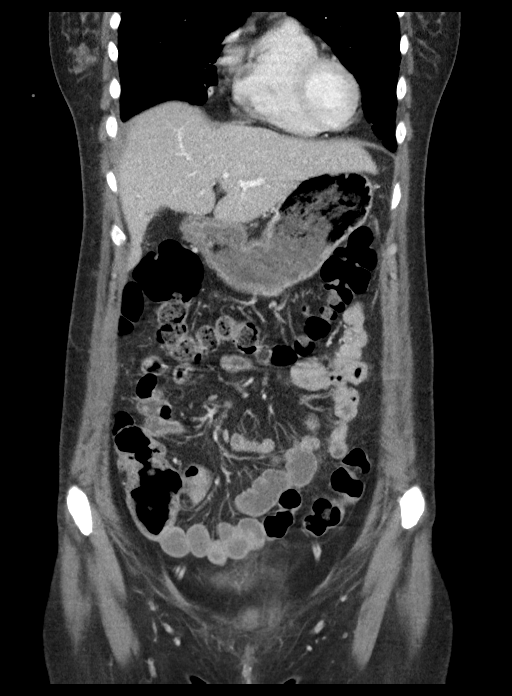
[im 36/80  soft-tissue]
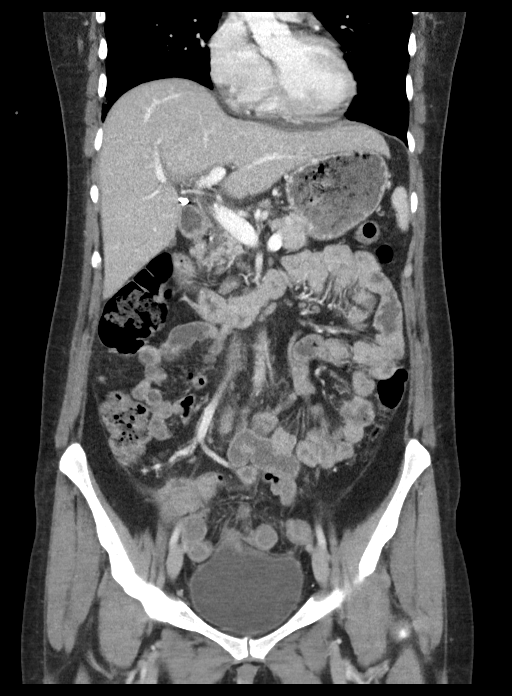
[im 44/80  soft-tissue]
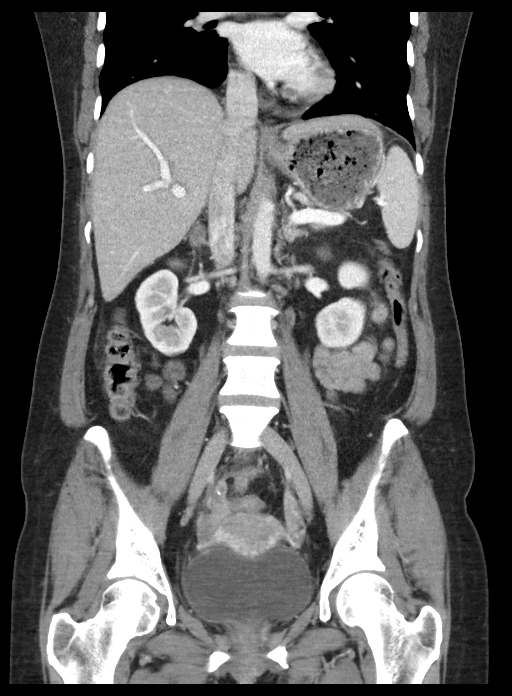

[15 of 46 positions shown; findings below may reference images not displayed]

FINDINGS: Lower chest: Bibasilar atelectasis. Normal sized heart without
pericardial effusion.

Hepatobiliary: Status post cholecystectomy. No choledocholithiasis.
No enhancing mass or fluid collections within the liver. No biliary
dilatation.

Pancreas: Normal

Spleen: Small splenule at the hilum.  No splenomegaly or focal mass.

Adrenals/Urinary Tract: Normal bilateral adrenal glands. Punctate
bilateral renal calculi without obstructive uropathy. Stable cyst in
the upper pole left kidney measuring up to 9 mm. No ureteral stones.
The urinary bladder is free of calculi, wall thickening and focal
mass.

Stomach/Bowel: Status post appendectomy without recurrence of
inflammation or fluid adjacent to the base of the cecum. No bowel
obstruction is identified. Once again there is a small fluid
collection in the cul-de-sac, smaller in appearance than on prior
currently estimated 4.2 x 2 x 1.2 cm versus 5.7 x 3 x 2.8 cm on
08/04/2016. The stomach is distended physiologically with food.
There is normal small bowel rotation. Generalized mild diffuse small
bowel thickening of jejunum may reflect stigmata of an enteritis.

Vascular/Lymphatic: No significant vascular findings are present. No
enlarged abdominal or pelvic lymph nodes.

Reproductive: Uterus and bilateral adnexa are unremarkable.

Other: No abdominal wall hernia or abnormality. No abdominopelvic
ascites.

Musculoskeletal: No acute or significant osseous findings.
IMPRESSION: 1. Bilateral nonobstructing renal calculi.
2. Mild diffuse thickened appearance of small bowel loops in the
left hemiabdomen question enteritis. No bowel obstruction is seen.
3. Status post appendectomy with smaller residual enhancing
cul-de-sac fluid now estimated at 4.2 x 2 x 1.2 cm versus 5.7 x 3 x
2.8 cm on prior comparison.
4. Status post cholecystectomy without biliary obstruction.

## 2019-11-26 ENCOUNTER — Encounter (HOSPITAL_COMMUNITY): Payer: Self-pay | Admitting: Emergency Medicine

## 2019-11-26 ENCOUNTER — Other Ambulatory Visit: Payer: Self-pay

## 2019-11-26 ENCOUNTER — Emergency Department (HOSPITAL_COMMUNITY)
Admission: EM | Admit: 2019-11-26 | Discharge: 2019-11-26 | Disposition: A | Discharge status: Home / Self Care | Attending: Emergency Medicine | Admitting: Emergency Medicine

## 2019-11-26 DIAGNOSIS — M545 Low back pain, unspecified: Secondary | ICD-10-CM | POA: Insufficient documentation

## 2019-11-26 DIAGNOSIS — Z87891 Personal history of nicotine dependence: Secondary | ICD-10-CM | POA: Insufficient documentation

## 2019-11-26 MED ORDER — PREDNISONE 10 MG PO TABS: ORAL_TABLET | ORAL | 0 refills | Status: DC

## 2019-11-26 NOTE — ED Triage Notes (Signed)
Pt c/o back pain that began 3-4 weeks ago at work. Pt states she twisted her lower back.

## 2019-11-26 NOTE — ED Notes (Signed)
Pt reports works for [employer] a body and twisted her back   Reports low back pain with rad to her Left hip   None reported down legs  No urinary nor fecal incontinence reported

## 2019-11-26 NOTE — ED Provider Notes (Signed)
[facility] EMERGENCY DEPARTMENT Provider Note   CSN: [id number] Arrival date & time: 11/26/19  1035     History Chief Complaint  Patient presents with  . Back Pain    Valerie Mitchell is a 44 y.o. female.  The history is provided by the patient. No language interpreter was used.  Back Pain Location:  Lumbar spine Quality:  Aching Radiates to:  R posterior upper leg Pain severity:  Moderate Pain is:  Same all the time Onset quality:  Sudden Progression:  Worsening Chronicity:  New Relieved by:  Nothing Worsened by:  Nothing Ineffective treatments:  None tried Associated symptoms: no weakness        Past Medical History:  Diagnosis Date  . Pancreatitis 11/2008   likely r/t ETOH  . Pyelonephritis 2010  . Renal insufficiency     Patient Active Problem List   Diagnosis Date Noted  . Pelvic abscess in female 08/04/2016  . Severe sepsis (HCC) 08/04/2016  . Encephalopathy 08/03/2016  . Change in mental status   . Acute appendicitis 07/30/2016  . Appendicitis 07/29/2016  . Gastroparesis 03/27/2010  . Chronic epigastric pain 03/27/2010  . IBS (irritable bowel syndrome) 03/27/2010  . Chronic diarrhea 03/27/2010  . Normocytic anemia 03/27/2010  . Transaminitis 03/27/2010  . Elevated serum creatinine 03/27/2010  . HEMATOCHEZIA 02/12/2010  . Abdominal pain 02/12/2010    Past Surgical History:  Procedure Laterality Date  . APPENDECTOMY    . EGD, Dr. Ronni Rumble [facility], bile-stained gastric mucosa  03/08/10  . EUS, Dr. Sonda Primes chronic pancreatitis, duodenal erosion, gb adenomyotosis  01/10/09  . LAPAROSCOPIC APPENDECTOMY N/A 07/30/2016   Procedure: APPENDECTOMY LAPAROSCOPIC;  Surgeon: Abigail Miyamoto, MD;  Location: [facility];  Service: General;  Laterality: N/A;  . LAPAROSCOPIC CHOLECYSTECTOMY  11/11  . TCS, Dr. Elmer Ramp colon and TI  02/14/10     OB History   No obstetric history on file.     Family History  Problem Relation Age of Onset   . Colon cancer Maternal Grandfather   . Stroke Mother     Social History   Tobacco Use  . Smoking status: Former Smoker    Packs/day: 0.80    Years: 20.00    Pack years: 16.00    Types: Cigarettes    Quit date: 07/29/2013    Years since quitting: 6.3  . Smokeless tobacco: Never Used  Substance Use Topics  . Alcohol use: No  . Drug use: No    Home Medications Prior to Admission medications   Medication Sig Start Date End Date Taking? Authorizing Provider  amoxicillin-clavulanate (AUGMENTIN) 875-125 MG tablet Take 1 tablet by mouth 2 (two) times daily. 08/10/16   [name], MD  predniSONE (DELTASONE) 10 MG tablet 6,5,4,3,2,1 taper 11/26/19   Elson Areas, PA-C    Allergies    Patient has no known allergies.  Review of Systems   Review of Systems  Musculoskeletal: Positive for back pain.  Neurological: Negative for weakness.  All other systems reviewed and are negative.   Physical Exam Updated Vital Signs BP (!) 120/96 (BP Location: Right Arm)   Pulse 85   Temp 98.5 F (36.9 C) (Oral)   Resp 16   Ht 5\' 7"  (1.702 m)   Wt 70.3 kg   LMP 11/24/2019 (Exact Date)   SpO2 100%   BMI 24.28 kg/m   Physical Exam Vitals and nursing note reviewed.  Constitutional:      Appearance: She is well-developed.  HENT:  Head: Normocephalic.  Cardiovascular:     Rate and Rhythm: Normal rate and regular rhythm.  Pulmonary:     Effort: Pulmonary effort is normal.  Abdominal:     General: There is no distension.  Musculoskeletal:        General: Tenderness present.     Cervical back: Normal range of motion.     Comments: Tender ls spine  Skin:    General: Skin is warm.  Neurological:     General: No focal deficit present.     Mental Status: She is alert and oriented to person, place, and time.     ED Results / Procedures / Treatments   Labs (all labs ordered are listed, but only abnormal results are displayed) Labs Reviewed - No data to  display  EKG None  Radiology No results found.  Procedures Procedures (including critical care time)  Medications Ordered in ED Medications - No data to display  ED Course  I have reviewed the triage vital signs and the nursing notes.  Pertinent labs & imaging results that were available during my care of the patient were reviewed by me and considered in my medical decision making (see chart for details).    MDM Rules/Calculators/A&P                          MDM:  Pt advised to follow up with ortho on call.  I will treat pt with prednisone.   Final Clinical Impression(s) / ED Diagnoses Final diagnoses:  Acute left-sided low back pain without sciatica    Rx / DC Orders ED Discharge Orders         Ordered    predniSONE (DELTASONE) 10 MG tablet       Note to Pharmacy: Please provide dose pack or taper instructions   11/26/19 1131        An After Visit Summary was printed and given to the patient.    Elson Areas, [place] 11/26/19 1756    Eber Hong, MD 11/27/19 [phone]

## 2019-11-26 NOTE — Discharge Instructions (Signed)
Return if any problems.

## 2019-12-05 ENCOUNTER — Telehealth: Payer: Self-pay | Admitting: Orthopedic Surgery

## 2019-12-05 NOTE — Telephone Encounter (Signed)
Call received from [employer] comp nurse case manager, Nicholos Johns, ph# [phone] / fax# [phone], inquiring about scheduling an appointment. States it would be an initial evaluation for back pain from date of injury 11/25/19, and per [facility] visit 11/26/19. I offered appointment, next available, with Dr Dallas Schimke. Claim has been approved, and our clinic had been on call 11/25/19 and 11/26/19.  Nurse case manager deferred for now - thanked [place] and said she may call back to schedule, and elects to discuss with the Water engineer, who may request to schedule directly with a back specialist.

## 2020-10-26 ENCOUNTER — Ambulatory Visit
Admission: RE | Admit: 2020-10-26 | Discharge: 2020-10-26 | Disposition: A | Discharge status: Home / Self Care | Payer: 59 | Source: Ambulatory Visit | Attending: Family Medicine | Admitting: Family Medicine

## 2020-10-26 ENCOUNTER — Other Ambulatory Visit: Payer: Self-pay

## 2020-10-26 VITALS — BP 128/86 | HR 105 | Temp 98.9°F | Resp 18

## 2020-10-26 DIAGNOSIS — J209 Acute bronchitis, unspecified: Secondary | ICD-10-CM | POA: Diagnosis not present

## 2020-10-26 DIAGNOSIS — M545 Low back pain, unspecified: Secondary | ICD-10-CM

## 2020-10-26 DIAGNOSIS — Z20822 Contact with and (suspected) exposure to covid-19: Secondary | ICD-10-CM

## 2020-10-26 DIAGNOSIS — J069 Acute upper respiratory infection, unspecified: Secondary | ICD-10-CM

## 2020-10-26 MED ORDER — ALBUTEROL SULFATE HFA 108 (90 BASE) MCG/ACT IN AERS: 1.0000 | INHALATION_SPRAY | Freq: Four times a day (QID) | RESPIRATORY_TRACT | 0 refills | Status: AC | PRN

## 2020-10-26 MED ORDER — PROMETHAZINE-DM 6.25-15 MG/5ML PO SYRP: 5.0000 mL | ORAL_SOLUTION | Freq: Four times a day (QID) | ORAL | 0 refills | Status: DC | PRN

## 2020-10-26 MED ORDER — PREDNISONE 10 MG PO TABS: ORAL_TABLET | ORAL | 0 refills | Status: DC

## 2020-10-26 NOTE — ED Provider Notes (Addendum)
[facility]    CSN: [id number] Arrival date & time: 10/26/20  1438      History   Chief Complaint No chief complaint on file.   HPI Valerie Mitchell is a 45 y.o. female.   Patient presenting today with 4-day history of cough, chest tightness, wheezing, congestion, sore throat, sneezing, back pain from coughing.  She states she has a history of a herniated disc and this has been irritating that area.  Denies chest pain, shortness of breath, abdominal pain, nausea vomiting diarrhea constipation.  So far trying over-the-counter pain relievers and cough syrups with minimal relief.  No known history of pulmonary disease.  No known sick contacts recently.  Past Medical History:  Diagnosis Date   Pancreatitis 11/2008   likely r/t ETOH   Pyelonephritis 2010   Renal insufficiency    Patient Active Problem List   Diagnosis Date Noted   Pelvic abscess in female 08/04/2016   Severe sepsis (HCC) 08/04/2016   Encephalopathy 08/03/2016   Change in mental status    Acute appendicitis 07/30/2016   Appendicitis 07/29/2016   Gastroparesis 03/27/2010   Chronic epigastric pain 03/27/2010   IBS (irritable bowel syndrome) 03/27/2010   Chronic diarrhea 03/27/2010   Normocytic anemia 03/27/2010   Transaminitis 03/27/2010   Elevated serum creatinine 03/27/2010   HEMATOCHEZIA 02/12/2010   Abdominal pain 02/12/2010    Past Surgical History:  Procedure Laterality Date   APPENDECTOMY     EGD, Dr. Ronni Rumble [facility], bile-stained gastric mucosa  03/08/10   EUS, Dr. Sonda Primes chronic pancreatitis, duodenal erosion, gb adenomyotosis  01/10/09   LAPAROSCOPIC APPENDECTOMY N/A 07/30/2016   Procedure: APPENDECTOMY LAPAROSCOPIC;  Surgeon: Abigail Miyamoto, MD;  Location: [facility];  Service: General;  Laterality: N/A;   LAPAROSCOPIC CHOLECYSTECTOMY  11/11   TCS, Dr. Elmer Ramp colon and TI  02/14/10    OB History   No obstetric history on file.      Home Medications     Prior to Admission medications   Medication Sig Start Date End Date Taking? Authorizing Provider  albuterol (VENTOLIN HFA) 108 (90 Base) MCG/ACT inhaler Inhale 1-2 puffs into the lungs every 6 (six) hours as needed for wheezing or shortness of breath. 10/26/20  Yes Particia Nearing, PA-C  predniSONE (DELTASONE) 10 MG tablet Take 6 tabs day one, 5 tabs day two, 4 tabs day three, etc 10/26/20  Yes Particia Nearing, PA-C  promethazine-dextromethorphan (PROMETHAZINE-DM) 6.25-15 MG/5ML syrup Take 5 mLs by mouth 4 (four) times daily as needed for cough. 10/26/20  Yes Particia Nearing, PA-C  amoxicillin-clavulanate (AUGMENTIN) 875-125 MG tablet Take 1 tablet by mouth 2 (two) times daily. 08/10/16   [name], MD  predniSONE (DELTASONE) 10 MG tablet 6,5,4,3,2,1 taper 11/26/19   Elson Areas, PA-C    Family History Family History  Problem Relation Age of Onset   Colon cancer Maternal Grandfather    Stroke Mother     Social History Social History   Tobacco Use   Smoking status: Former    Packs/day: 0.80    Years: 20.00    Pack years: 16.00    Types: Cigarettes    Quit date: 07/29/2013    Years since quitting: 7.2   Smokeless tobacco: Never  Substance Use Topics   Alcohol use: No   Drug use: No     Allergies   Patient has no known allergies.   Review of Systems Review of Systems Per HPI  Physical Exam Triage Vital Signs ED  Triage Vitals  Enc Vitals Group     BP 10/26/20 1508 128/86     Pulse Rate 10/26/20 1508 (!) 105     Resp 10/26/20 1508 18     Temp 10/26/20 1508 98.9 F (37.2 C)     Temp Source 10/26/20 1508 Oral     SpO2 10/26/20 1508 98 %     Weight --      Height --      Head Circumference --      Peak Flow --      Pain Score 10/26/20 1510 5     Pain Loc --      Pain Edu? --      Excl. in GC? --    No data found.  Updated Vital Signs BP 128/86 (BP Location: Right Arm)   Pulse (!) 105   Temp 98.9 F (37.2 C) (Oral)   Resp 18    LMP 10/11/2020 (Exact Date)   SpO2 98%   Visual Acuity Right Eye Distance:   Left Eye Distance:   Bilateral Distance:    Right Eye Near:   Left Eye Near:    Bilateral Near:     Physical Exam Vitals and nursing note reviewed.  Constitutional:      Appearance: Normal appearance. She is not ill-appearing.  HENT:     Head: Atraumatic.     Right Ear: Tympanic membrane normal.     Left Ear: Tympanic membrane normal.     Nose: Congestion and rhinorrhea present.     Mouth/Throat:     Mouth: Mucous membranes are moist.     Pharynx: Posterior oropharyngeal erythema present.  Eyes:     Extraocular Movements: Extraocular movements intact.     Conjunctiva/sclera: Conjunctivae normal.  Cardiovascular:     Rate and Rhythm: Normal rate and regular rhythm.     Heart sounds: Normal heart sounds.  Pulmonary:     Effort: Pulmonary effort is normal.     Breath sounds: Wheezing present. No rales.     Comments: Minimal scattered wheezes bilaterally Musculoskeletal:        General: Normal range of motion.     Cervical back: Normal range of motion and neck supple.  Skin:    General: Skin is warm and dry.  Neurological:     Mental Status: She is alert and oriented to person, place, and time.     Motor: No weakness.     Gait: Gait normal.  Psychiatric:        Mood and Affect: Mood normal.        Thought Content: Thought content normal.        Judgment: Judgment normal.    UC Treatments / Results  Labs (all labs ordered are listed, but only abnormal results are displayed) Labs Reviewed  COVID-19, FLU A+B NAA    EKG   Radiology No results found.  Procedures Procedures (including critical care time)  Medications Ordered in UC Medications - No data to display  Initial Impression / Assessment and Plan / UC Course  I have reviewed the triage vital signs and the nursing notes.  Pertinent labs & imaging results that were available during my care of the patient were reviewed by me  and considered in my medical decision making (see chart for details).     Minimally tachycardic in triage but otherwise vital signs reassuring.  Suspect viral illness causing symptoms, developing into bronchitis.  We will treat with prednisone, albuterol inhaler, Phenergan DM and  continued congestion medications and supportive home care.  Suspect the steroid will also help with her flared back pain from herniated disc.  COVID and flu testing pending, quarantine protocol reviewed.  Return for acutely worsening symptoms.  Final Clinical Impressions(s) / UC Diagnoses   Final diagnoses:  Exposure to COVID-19 virus  Viral URI with cough  Acute bronchitis, unspecified organism  Acute midline low back pain, unspecified whether sciatica present   Discharge Instructions   None    ED Prescriptions     Medication Sig Dispense Auth. Provider   predniSONE (DELTASONE) 10 MG tablet Take 6 tabs day one, 5 tabs day two, 4 tabs day three, etc 21 tablet Particia Nearing, PA-C   promethazine-dextromethorphan (PROMETHAZINE-DM) 6.25-15 MG/5ML syrup Take 5 mLs by mouth 4 (four) times daily as needed for cough. 100 mL Particia Nearing, PA-C   albuterol (VENTOLIN HFA) 108 (90 Base) MCG/ACT inhaler Inhale 1-2 puffs into the lungs every 6 (six) hours as needed for wheezing or shortness of breath. 18 g Particia Nearing, [place]      PDMP not reviewed this encounter.   Particia Nearing, PA-C 10/26/20 1530    Roosvelt Maser [place], [place] 10/26/20 1530

## 2020-10-26 NOTE — ED Triage Notes (Signed)
Cough, nasal congestion, sore throat, sneezing, and chills since Tuesday.

## 2020-10-27 LAB — COVID-19, FLU A+B NAA
Influenza A, NAA: NOT DETECTED
Influenza B, NAA: NOT DETECTED
SARS-CoV-2, NAA: DETECTED — AB

## 2020-10-29 ENCOUNTER — Encounter: Payer: Self-pay | Admitting: Obstetrics and Gynecology

## 2020-11-22 ENCOUNTER — Ambulatory Visit: Payer: 59 | Admitting: Obstetrics and Gynecology

## 2020-12-26 ENCOUNTER — Ambulatory Visit: Payer: 59 | Admitting: Obstetrics and Gynecology

## 2021-02-28 ENCOUNTER — Ambulatory Visit
Admission: EM | Admit: 2021-02-28 | Discharge: 2021-02-28 | Disposition: A | Discharge status: Home / Self Care | Payer: Self-pay

## 2021-02-28 ENCOUNTER — Other Ambulatory Visit: Payer: Self-pay

## 2021-02-28 DIAGNOSIS — W540XXA Bitten by dog, initial encounter: Secondary | ICD-10-CM

## 2021-02-28 DIAGNOSIS — S7012XA Contusion of left thigh, initial encounter: Secondary | ICD-10-CM

## 2021-02-28 DIAGNOSIS — M79652 Pain in left thigh: Secondary | ICD-10-CM

## 2021-02-28 DIAGNOSIS — Z23 Encounter for immunization: Secondary | ICD-10-CM

## 2021-02-28 DIAGNOSIS — S7011XA Contusion of right thigh, initial encounter: Secondary | ICD-10-CM

## 2021-02-28 DIAGNOSIS — T148XXA Other injury of unspecified body region, initial encounter: Secondary | ICD-10-CM

## 2021-02-28 MED ORDER — TETANUS-DIPHTH-ACELL PERTUSSIS 5-2.5-18.5 LF-MCG/0.5 IM SUSY
0.5000 mL | PREFILLED_SYRINGE | Freq: Once | INTRAMUSCULAR | Status: AC
  Administered 2021-02-28: 0.5 mL via INTRAMUSCULAR

## 2021-02-28 NOTE — ED Provider Notes (Signed)
[facility]   MRN: [id number] DOB: 1975-02-28  Subjective:   Valerie Mitchell is a 46 y.o. female presenting for 4-day history of persistent swelling to the left thigh from a dog bite.  The dog is known, vaccines are up-to-date.  Patient reports that in general she has minimal tenderness but wanted to be evaluated since she still had swelling.  There is bruising over both the thighs where she was bit.  Feels like they were more scrapes than puncture wounds.  No fever, drainage of pus or bleeding, warmth, erythema.  Does not take chronic medications.  No Known Allergies  Past Medical History:  Diagnosis Date   Pancreatitis 11/2008   likely r/t ETOH   Pyelonephritis 2010   Renal insufficiency      Past Surgical History:  Procedure Laterality Date   APPENDECTOMY     EGD, Dr. Ronni Rumble [facility], bile-stained gastric mucosa  03/08/10   EUS, Dr. Sonda Primes chronic pancreatitis, duodenal erosion, gb adenomyotosis  01/10/09   LAPAROSCOPIC APPENDECTOMY N/A 07/30/2016   Procedure: APPENDECTOMY LAPAROSCOPIC;  Surgeon: Abigail Miyamoto, MD;  Location: [facility];  Service: General;  Laterality: N/A;   LAPAROSCOPIC CHOLECYSTECTOMY  11/11   TCS, Dr. Elmer Ramp colon and TI  02/14/10    Family History  Problem Relation Age of Onset   Colon cancer Maternal Grandfather    Stroke Mother     Social History   Tobacco Use   Smoking status: Former    Packs/day: 0.80    Years: 20.00    Pack years: 16.00    Types: Cigarettes    Quit date: 07/29/2013    Years since quitting: 7.5   Smokeless tobacco: Never  Substance Use Topics   Alcohol use: No   Drug use: No    ROS   Objective:   Vitals: BP (!) 153/90 (BP Location: Right Arm)    Pulse 98    Temp 98.2 F (36.8 C) (Oral)    Resp 16    SpO2 98%   Physical Exam Constitutional:      General: She is not in acute distress.    Appearance: Normal appearance. She is well-developed. She is not ill-appearing,  toxic-appearing or diaphoretic.  HENT:     Head: Normocephalic and atraumatic.     Nose: Nose normal.     Mouth/Throat:     Mouth: Mucous membranes are moist.  Eyes:     General: No scleral icterus.       Right eye: No discharge.        Left eye: No discharge.     Extraocular Movements: Extraocular movements intact.  Cardiovascular:     Rate and Rhythm: Normal rate.  Pulmonary:     Effort: Pulmonary effort is normal.  Skin:    General: Skin is warm and dry.     Comments: Multiple resolving dog bite wounds over the thighs bilaterally.  There is associated ecchymosis.  No drainage of pus or bleeding.  There is minimal tenderness over the left anterior thigh with associated swelling/resolving hematoma.  Neurological:     General: No focal deficit present.     Mental Status: She is alert and oriented to person, place, and time.  Psychiatric:        Mood and Affect: Mood normal.        Behavior: Behavior normal.         Assessment and Plan :   PDMP not reviewed this encounter.  1. Left thigh pain  2. Hematoma   3. Traumatic ecchymosis of right thigh, initial encounter   4. Traumatic ecchymosis of left thigh, initial encounter    Low suspicion for an infected dog bite.  Recommended conservative management for hematoma.  Offered her a prescription for naproxen but patient states she has it at home and will use it.  Tdap updated in clinic today. Counseled patient on potential for adverse effects with medications prescribed/recommended today, ER and return-to-clinic precautions discussed, patient verbalized understanding.    Jaynee Eagles, [place] 02/28/21 [phone]

## 2021-02-28 NOTE — ED Triage Notes (Signed)
Pt reports dog bite in the left leg x 4 days. Reports the are is swollen and sore.   Dog has a immunizations up to date.

## 2021-07-01 ENCOUNTER — Ambulatory Visit
Admission: RE | Admit: 2021-07-01 | Discharge: 2021-07-01 | Disposition: A | Discharge status: Home / Self Care | Payer: Self-pay | Source: Ambulatory Visit | Attending: Nurse Practitioner | Admitting: Nurse Practitioner

## 2021-07-01 VITALS — BP 170/96 | HR 80 | Temp 97.7°F | Resp 20

## 2021-07-01 DIAGNOSIS — R1032 Left lower quadrant pain: Secondary | ICD-10-CM | POA: Insufficient documentation

## 2021-07-01 DIAGNOSIS — R300 Dysuria: Secondary | ICD-10-CM | POA: Insufficient documentation

## 2021-07-01 LAB — POCT URINALYSIS DIP (MANUAL ENTRY)
Blood, UA: NEGATIVE
Glucose, UA: 100 mg/dL — AB
Nitrite, UA: POSITIVE — AB
Protein Ur, POC: 30 mg/dL — AB
Spec Grav, UA: 1.02 (ref 1.010–1.025)
Urobilinogen, UA: 2 E.U./dL — AB
pH, UA: 6 (ref 5.0–8.0)

## 2021-07-01 LAB — POCT URINE PREGNANCY: Preg Test, Ur: NEGATIVE

## 2021-07-01 MED ORDER — KETOROLAC TROMETHAMINE 60 MG/2ML IM SOLN
60.0000 mg | Freq: Once | INTRAMUSCULAR | Status: AC
  Administered 2021-07-01: 30 mg via INTRAMUSCULAR

## 2021-07-01 MED ORDER — SULFAMETHOXAZOLE-TRIMETHOPRIM 800-160 MG PO TABS: 1.0000 | ORAL_TABLET | Freq: Two times a day (BID) | ORAL | 0 refills | Status: AC

## 2021-07-01 MED ORDER — KETOROLAC TROMETHAMINE 60 MG/2ML IM SOLN: 30.0000 mg | Freq: Once | INTRAMUSCULAR | Status: DC

## 2021-07-01 NOTE — ED Provider Notes (Signed)
[facility]    CSN: [id number] Arrival date & time: 07/01/21  1201      History   Chief Complaint Chief Complaint  Patient presents with   Urinary Frequency    Likely UTI - Entered by patient   Abdominal Pain    HPI Valerie Mitchell is a 46 y.o. female.   Patient presents with 1 day of dysuria, urinary frequency, urgency, voiding small amounts, and lower abdominal pain.  She denies new urinary incontinence, foul odor, hematuria, new back pain, suprapubic pain/pressure, fevers, nausea/vomiting.  Patient reports she passed a kidney stone about a week ago; has a history of kidney stones.  Denies vaginal discharge.  Has tried Azo with minimal relief.  Reports this pain is similar to when she has had a kidney stone in the past.     Past Medical History:  Diagnosis Date   Pancreatitis 11/2008   likely r/t ETOH   Pyelonephritis 2010   Renal insufficiency     Patient Active Problem List   Diagnosis Date Noted   Pelvic abscess in female 08/04/2016   Severe sepsis (HCC) 08/04/2016   Encephalopathy 08/03/2016   Change in mental status    Acute appendicitis 07/30/2016   Appendicitis 07/29/2016   Gastroparesis 03/27/2010   Chronic epigastric pain 03/27/2010   IBS (irritable bowel syndrome) 03/27/2010   Chronic diarrhea 03/27/2010   Normocytic anemia 03/27/2010   Transaminitis 03/27/2010   Elevated serum creatinine 03/27/2010   HEMATOCHEZIA 02/12/2010   Abdominal pain 02/12/2010    Past Surgical History:  Procedure Laterality Date   APPENDECTOMY     EGD, Dr. Ronni Rumble [facility], bile-stained gastric mucosa  03/08/10   EUS, Dr. Sonda Primes chronic pancreatitis, duodenal erosion, gb adenomyotosis  01/10/09   LAPAROSCOPIC APPENDECTOMY N/A 07/30/2016   Procedure: APPENDECTOMY LAPAROSCOPIC;  Surgeon: Abigail Miyamoto, MD;  Location: [facility];  Service: General;  Laterality: N/A;   LAPAROSCOPIC CHOLECYSTECTOMY  11/11   TCS, Dr. Elmer Ramp colon and TI   02/14/10    OB History   No obstetric history on file.      Home Medications    Prior to Admission medications   Medication Sig Start Date End Date Taking? Authorizing Provider  sulfamethoxazole-trimethoprim (BACTRIM DS) 800-160 MG tablet Take 1 tablet by mouth 2 (two) times daily for 5 days. 07/01/21 07/06/21 Yes Valentino Nose, NP  albuterol (VENTOLIN HFA) 108 (90 Base) MCG/ACT inhaler Inhale 1-2 puffs into the lungs every 6 (six) hours as needed for wheezing or shortness of breath. 10/26/20   Particia Nearing, PA-C  Multiple Vitamin (MULTIVITAMIN) tablet Take 1 tablet by mouth daily.    [provider]    Family History Family History  Problem Relation Age of Onset   Colon cancer Maternal Grandfather    Stroke Mother     Social History Social History   Tobacco Use   Smoking status: Former    Packs/day: 0.80    Years: 20.00    Total pack years: 16.00    Types: Cigarettes    Quit date: 07/29/2013    Years since quitting: 7.9   Smokeless tobacco: Never  Substance Use Topics   Alcohol use: No   Drug use: No     Allergies   Patient has no known allergies.   Review of Systems Review of Systems Per HPI  Physical Exam Triage Vital Signs ED Triage Vitals  Enc Vitals Group     BP 07/01/21 1207 (!) 170/96  Pulse Rate 07/01/21 1207 80     Resp 07/01/21 1207 20     Temp 07/01/21 1207 97.7 F (36.5 C)     Temp src --      SpO2 07/01/21 1207 98 %     Weight --      Height --      Head Circumference --      Peak Flow --      Pain Score 07/01/21 1205 7     Pain Loc --      Pain Edu? --      Excl. in [place]? --    No data found.  Updated Vital Signs BP (!) 170/96   Pulse 80   Temp 97.7 F (36.5 C)   Resp 20   LMP 06/10/2021 (Approximate)   SpO2 98%   Visual Acuity Right Eye Distance:   Left Eye Distance:   Bilateral Distance:    Right Eye Near:   Left Eye Near:    Bilateral Near:     Physical Exam Vitals and nursing note  reviewed.  Constitutional:      General: She is not in acute distress.    Appearance: She is normal weight. She is not toxic-appearing.  HENT:     Mouth/Throat:     Mouth: Mucous membranes are moist.     Pharynx: Oropharynx is clear.  Cardiovascular:     Rate and Rhythm: Normal rate and regular rhythm.  Pulmonary:     Effort: Pulmonary effort is normal. No respiratory distress.  Abdominal:     General: Abdomen is flat. Bowel sounds are normal. There is no distension.     Palpations: Abdomen is soft. There is no mass.     Tenderness: There is abdominal tenderness in the left lower quadrant. There is no right CVA tenderness, left CVA tenderness or guarding. Negative signs include Murphy's sign and McBurney's sign.  Skin:    General: Skin is warm and dry.     Capillary Refill: Capillary refill takes less than 2 seconds.     Coloration: Skin is not jaundiced or pale.     Findings: No erythema or rash.  Neurological:     Mental Status: She is alert and oriented to person, place, and time.     Gait: Gait normal.  Psychiatric:        Behavior: Behavior is cooperative.      UC Treatments / Results  Labs (all labs ordered are listed, but only abnormal results are displayed) Labs Reviewed  POCT URINALYSIS DIP (MANUAL ENTRY) - Abnormal; Notable for the following components:      Result Value   Color, UA orange (*)    Glucose, UA =100 (*)    Bilirubin, UA small (*)    Ketones, POC UA trace (5) (*)    Protein Ur, POC =30 (*)    Urobilinogen, UA 2.0 (*)    Nitrite, UA Positive (*)    Leukocytes, UA Large (3+) (*)    All other components within normal limits  URINE CULTURE  POCT URINE PREGNANCY    EKG   Radiology No results found.  Procedures Procedures (including critical care time)  Medications Ordered in UC Medications  ketorolac (TORADOL) injection 60 mg (30 mg Intramuscular Given 07/01/21 1302)    Initial Impression / Assessment and Plan / UC Course  I have  reviewed the triage vital signs and the nursing notes.  Pertinent labs & imaging results that were available during my care of  the patient were reviewed by me and considered in my medical decision making (see chart for details).    Patient is a very pleasant, uncomfortable-appearing 46 year old female with urinary tract infection symptoms today.  Urinalysis is unreliable given use of Azo.  We will treat based on symptoms with Bactrim DS twice daily for 5 days.  Urine culture pending.  Toradol 30 mg IM given in urgent care today to help with pain, encouraged use of Tylenol 500 to 1000 mg every 6 hours as needed for pain.  Suspect lower abdominal pain is secondary to urinary symptoms, however we discussed that this worsens or if she develops high fevers, nausea/vomiting, changes in her bowel movements, blood in her stool, she is to go to the ER.  ER precautions discussed.  Patient declines note for work.  The patient was given the opportunity to ask questions.  All questions answered to their satisfaction.  The patient is in agreement to this plan.   Final Clinical Impressions(s) / UC Diagnoses   Final diagnoses:  Dysuria  LLQ abdominal pain     Discharge Instructions      - We are sending the urine for a culture and will let you know if it comes back showing you need to change antibiotics; in the meantime, please start on Bactrim twice daily for 5 days to help with the possible UTI -We have given you a shot of Toradol today to help with the pain.  Please do not take any more ibuprofen.  Instead, you can take Tylenol 500 to 1000 mg every 6 hours as needed for pain -If your symptoms worsen you develop fevers, nausea/vomiting and unable to keep fluids down, severe back pain, or severe abdominal pain, please go to the emergency room   ED Prescriptions     Medication Sig Dispense Auth. Provider   sulfamethoxazole-trimethoprim (BACTRIM DS) 800-160 MG tablet Take 1 tablet by mouth 2 (two) times  daily for 5 days. 10 tablet Valentino Nose, NP      PDMP not reviewed this encounter.   Valentino Nose, NP 07/01/21 1407

## 2021-07-02 LAB — URINE CULTURE: Culture: NO GROWTH

## 2021-07-29 ENCOUNTER — Ambulatory Visit
Admission: EM | Admit: 2021-07-29 | Discharge: 2021-07-29 | Disposition: A | Discharge status: Home / Self Care | Payer: Self-pay | Attending: Family Medicine | Admitting: Family Medicine

## 2021-07-29 DIAGNOSIS — U071 COVID-19: Secondary | ICD-10-CM

## 2021-07-29 DIAGNOSIS — R062 Wheezing: Secondary | ICD-10-CM

## 2021-07-29 MED ORDER — ALBUTEROL SULFATE (2.5 MG/3ML) 0.083% IN NEBU
2.5000 mg | INHALATION_SOLUTION | Freq: Once | RESPIRATORY_TRACT | Status: AC
  Administered 2021-07-29: 2.5 mg via RESPIRATORY_TRACT

## 2021-07-29 MED ORDER — ALBUTEROL SULFATE HFA 108 (90 BASE) MCG/ACT IN AERS: 1.0000 | INHALATION_SPRAY | Freq: Four times a day (QID) | RESPIRATORY_TRACT | 0 refills | Status: AC | PRN

## 2021-07-29 MED ORDER — MOLNUPIRAVIR EUA 200MG CAPSULE: 4.0000 | ORAL_CAPSULE | Freq: Two times a day (BID) | ORAL | 0 refills | Status: AC

## 2021-07-29 MED ORDER — PROMETHAZINE-DM 6.25-15 MG/5ML PO SYRP: 5.0000 mL | ORAL_SOLUTION | Freq: Four times a day (QID) | ORAL | 0 refills | Status: DC | PRN

## 2021-07-29 NOTE — ED Triage Notes (Signed)
Pt presents with cough and nasal congestion, covid test positive , symptoms began Saturday

## 2021-07-29 NOTE — ED Provider Notes (Signed)
[facility]    CSN: [id number] Arrival date & time: 07/29/21  0945      History   Chief Complaint Chief Complaint  Patient presents with   Cough    HPI Valerie Mitchell is a 46 y.o. female.   Presenting today with 3-day history of cough, congestion, fever, body aches, chills, chest tightness, wheezing, fatigue.  Denies chest pain, shortness of breath, abdominal pain, nausea vomiting or diarrhea.  Home COVID test positive yesterday evening.  Taking Alka-Seltzer cold and sinus with minimal relief.  No known history of cardiopulmonary disease.    Past Medical History:  Diagnosis Date   Pancreatitis 11/2008   likely r/t ETOH   Pyelonephritis 2010   Renal insufficiency     Patient Active Problem List   Diagnosis Date Noted   Pelvic abscess in female 08/04/2016   Severe sepsis (HCC) 08/04/2016   Encephalopathy 08/03/2016   Change in mental status    Acute appendicitis 07/30/2016   Appendicitis 07/29/2016   Gastroparesis 03/27/2010   Chronic epigastric pain 03/27/2010   IBS (irritable bowel syndrome) 03/27/2010   Chronic diarrhea 03/27/2010   Normocytic anemia 03/27/2010   Transaminitis 03/27/2010   Elevated serum creatinine 03/27/2010   HEMATOCHEZIA 02/12/2010   Abdominal pain 02/12/2010    Past Surgical History:  Procedure Laterality Date   APPENDECTOMY     EGD, Dr. Ronni Rumble [facility], bile-stained gastric mucosa  03/08/10   EUS, Dr. Sonda Primes chronic pancreatitis, duodenal erosion, gb adenomyotosis  01/10/09   LAPAROSCOPIC APPENDECTOMY N/A 07/30/2016   Procedure: APPENDECTOMY LAPAROSCOPIC;  Surgeon: Abigail Miyamoto, MD;  Location: [facility];  Service: General;  Laterality: N/A;   LAPAROSCOPIC CHOLECYSTECTOMY  11/11   TCS, Dr. Elmer Ramp colon and TI  02/14/10    OB History   No obstetric history on file.      Home Medications    Prior to Admission medications   Medication Sig Start Date End Date Taking? Authorizing Provider   albuterol (VENTOLIN HFA) 108 (90 Base) MCG/ACT inhaler Inhale 1-2 puffs into the lungs every 6 (six) hours as needed for wheezing or shortness of breath. 07/29/21  Yes Particia Nearing, PA-C  molnupiravir EUA (LAGEVRIO) 200 mg CAPS capsule Take 4 capsules (800 mg total) by mouth 2 (two) times daily for 5 days. 07/29/21 08/03/21 Yes Particia Nearing, PA-C  promethazine-dextromethorphan (PROMETHAZINE-DM) 6.25-15 MG/5ML syrup Take 5 mLs by mouth 4 (four) times daily as needed. 07/29/21  Yes Particia Nearing, PA-C  albuterol (VENTOLIN HFA) 108 (90 Base) MCG/ACT inhaler Inhale 1-2 puffs into the lungs every 6 (six) hours as needed for wheezing or shortness of breath. 10/26/20   Particia Nearing, PA-C  Multiple Vitamin (MULTIVITAMIN) tablet Take 1 tablet by mouth daily.    [provider]    Family History Family History  Problem Relation Age of Onset   Colon cancer Maternal Grandfather    Stroke Mother     Social History Social History   Tobacco Use   Smoking status: Former    Packs/day: 0.80    Years: 20.00    Total pack years: 16.00    Types: Cigarettes    Quit date: 07/29/2013    Years since quitting: 8.0   Smokeless tobacco: Never  Substance Use Topics   Alcohol use: No   Drug use: No     Allergies   Patient has no known allergies.   Review of Systems Review of Systems PER HPI  Physical Exam Triage Vital Signs  ED Triage Vitals  Enc Vitals Group     BP 07/29/21 1049 (!) 149/91     Pulse Rate 07/29/21 1049 90     Resp 07/29/21 1049 18     Temp 07/29/21 1049 98.1 F (36.7 C)     Temp src --      SpO2 07/29/21 1049 98 %     Weight --      Height --      Head Circumference --      Peak Flow --      Pain Score 07/29/21 1047 5     Pain Loc --      Pain Edu? --      Excl. in [place]? --    No data found.  Updated Vital Signs BP (!) 149/91 Comment: coughing  Pulse 90   Temp 98.1 F (36.7 C)   Resp 18   LMP 06/10/2021 (Approximate)    SpO2 98%   Visual Acuity Right Eye Distance:   Left Eye Distance:   Bilateral Distance:    Right Eye Near:   Left Eye Near:    Bilateral Near:     Physical Exam Vitals and nursing note reviewed.  Constitutional:      Appearance: Normal appearance. She is not ill-appearing.  HENT:     Head: Atraumatic.     Nose: Rhinorrhea present.     Mouth/Throat:     Mouth: Mucous membranes are moist.     Pharynx: Posterior oropharyngeal erythema present. No oropharyngeal exudate.  Eyes:     Extraocular Movements: Extraocular movements intact.     Conjunctiva/sclera: Conjunctivae normal.  Cardiovascular:     Rate and Rhythm: Normal rate and regular rhythm.     Heart sounds: Normal heart sounds.  Pulmonary:     Effort: Pulmonary effort is normal.     Breath sounds: Wheezing present.     Comments: Trace wheezes bilaterally Significant treatment post albuterol nebulizer treatment Musculoskeletal:        General: Normal range of motion.     Cervical back: Normal range of motion and neck supple.  Skin:    General: Skin is warm and dry.  Neurological:     Mental Status: She is alert and oriented to person, place, and time.  Psychiatric:        Mood and Affect: Mood normal.        Thought Content: Thought content normal.        Judgment: Judgment normal.    UC Treatments / Results  Labs (all labs ordered are listed, but only abnormal results are displayed) Labs Reviewed - No data to display  EKG   Radiology No results found.  Procedures Procedures (including critical care time)  Medications Ordered in UC Medications  albuterol (PROVENTIL) (2.5 MG/3ML) 0.083% nebulizer solution 2.5 mg (2.5 mg Nebulization Given 07/29/21 1108)    Initial Impression / Assessment and Plan / UC Course  I have reviewed the triage vital signs and the nursing notes.  Pertinent labs & imaging results that were available during my care of the patient were reviewed by me and considered in my medical  decision making (see chart for details).     Home COVID test positive, start molnupiravir, albuterol inhaler, Phenergan DM, supportive over-the-counter medications and home care.  Significant improvement after albuterol nebulizer treatment in clinic.  Return for worsening symptoms.  Final Clinical Impressions(s) / UC Diagnoses   Final diagnoses:  COVID-19  Wheezing   Discharge Instructions  None    ED Prescriptions     Medication Sig Dispense Auth. Provider   molnupiravir EUA (LAGEVRIO) 200 mg CAPS capsule Take 4 capsules (800 mg total) by mouth 2 (two) times daily for 5 days. 40 capsule Particia Nearing, [place]   albuterol (VENTOLIN HFA) 108 (90 Base) MCG/ACT inhaler Inhale 1-2 puffs into the lungs every 6 (six) hours as needed for wheezing or shortness of breath. 18 g Roosvelt Maser [place], [place]   promethazine-dextromethorphan (PROMETHAZINE-DM) 6.25-15 MG/5ML syrup Take 5 mLs by mouth 4 (four) times daily as needed. 100 mL Particia Nearing, [place]      PDMP not reviewed this encounter.   Particia Nearing, [place] 07/29/21 1146

## 2021-10-31 ENCOUNTER — Encounter: Payer: Self-pay | Admitting: *Deleted

## 2021-10-31 ENCOUNTER — Ambulatory Visit
Admission: EM | Admit: 2021-10-31 | Discharge: 2021-10-31 | Disposition: A | Discharge status: Home / Self Care | Payer: Self-pay | Attending: Nurse Practitioner | Admitting: Nurse Practitioner

## 2021-10-31 DIAGNOSIS — J069 Acute upper respiratory infection, unspecified: Secondary | ICD-10-CM

## 2021-10-31 DIAGNOSIS — R062 Wheezing: Secondary | ICD-10-CM

## 2021-10-31 MED ORDER — ALBUTEROL SULFATE (2.5 MG/3ML) 0.083% IN NEBU
2.5000 mg | INHALATION_SOLUTION | Freq: Once | RESPIRATORY_TRACT | Status: AC
  Administered 2021-10-31: 2.5 mg via RESPIRATORY_TRACT

## 2021-10-31 MED ORDER — PROMETHAZINE-DM 6.25-15 MG/5ML PO SYRP: 5.0000 mL | ORAL_SOLUTION | Freq: Every evening | ORAL | 0 refills | Status: DC | PRN

## 2021-10-31 MED ORDER — BENZONATATE 100 MG PO CAPS: 100.0000 mg | ORAL_CAPSULE | Freq: Three times a day (TID) | ORAL | 0 refills | Status: DC | PRN

## 2021-10-31 NOTE — ED Provider Notes (Signed)
[facility]    CSN: [id number] Arrival date & time: 10/31/21  2979      History   Chief Complaint Chief Complaint  Patient presents with   Cough   Shortness of Breath    HPI Valerie Mitchell is a 46 y.o. female.   Patient presents for 1 day of fever at home, dry cough, constant shortness of breath, some wheezing, chest tightness and chest congestion, nasal congestion, runny nose, postnasal drainage, sore throat, decreased appetite, and fatigue along with slight headache from coughing.  She denies chest pain, ear pain or drainage, abdominal pain, nausea/vomiting, diarrhea, and new rash.  Has taken ibuprofen and Sudafed which helps with symptoms minimally.  Ibuprofen did break the fever last night.  Reports she had COVID-19 about 3 months ago and fully improved from that sickness.  No known history of chronic lung disease.    Past Medical History:  Diagnosis Date   Pancreatitis 11/2008   likely r/t ETOH   Pyelonephritis 2010   Renal insufficiency     Patient Active Problem List   Diagnosis Date Noted   Pelvic abscess in female 08/04/2016   Severe sepsis (HCC) 08/04/2016   Encephalopathy 08/03/2016   Change in mental status    Acute appendicitis 07/30/2016   Appendicitis 07/29/2016   Gastroparesis 03/27/2010   Chronic epigastric pain 03/27/2010   IBS (irritable bowel syndrome) 03/27/2010   Chronic diarrhea 03/27/2010   Normocytic anemia 03/27/2010   Transaminitis 03/27/2010   Elevated serum creatinine 03/27/2010   HEMATOCHEZIA 02/12/2010   Abdominal pain 02/12/2010    Past Surgical History:  Procedure Laterality Date   APPENDECTOMY     EGD, Dr. Ronni Rumble [facility], bile-stained gastric mucosa  03/08/10   EUS, Dr. Sonda Primes chronic pancreatitis, duodenal erosion, gb adenomyotosis  01/10/09   LAPAROSCOPIC APPENDECTOMY N/A 07/30/2016   Procedure: APPENDECTOMY LAPAROSCOPIC;  Surgeon: Abigail Miyamoto, MD;  Location: [facility];  Service: General;   Laterality: N/A;   LAPAROSCOPIC CHOLECYSTECTOMY  11/11   TCS, Dr. Elmer Ramp colon and TI  02/14/10    OB History   No obstetric history on file.      Home Medications    Prior to Admission medications   Medication Sig Start Date End Date Taking? Authorizing Provider  benzonatate (TESSALON) 100 MG capsule Take 1 capsule (100 mg total) by mouth 3 (three) times daily as needed for cough. Do not take with alcohol or while driving or operating heavy machinery.  May cause drowsiness. 10/31/21  Yes Valentino Nose, NP  Multiple Vitamin (MULTIVITAMIN) tablet Take 1 tablet by mouth daily.   Yes [provider]  albuterol (VENTOLIN HFA) 108 (90 Base) MCG/ACT inhaler Inhale 1-2 puffs into the lungs every 6 (six) hours as needed for wheezing or shortness of breath. 10/26/20   Particia Nearing, PA-C  albuterol (VENTOLIN HFA) 108 (90 Base) MCG/ACT inhaler Inhale 1-2 puffs into the lungs every 6 (six) hours as needed for wheezing or shortness of breath. 07/29/21   Particia Nearing, PA-C  promethazine-dextromethorphan (PROMETHAZINE-DM) 6.25-15 MG/5ML syrup Take 5 mLs by mouth at bedtime as needed. 10/31/21   Valentino Nose, NP    Family History Family History  Problem Relation Age of Onset   Colon cancer Maternal Grandfather    Stroke Mother     Social History Social History   Tobacco Use   Smoking status: Former    Packs/day: 0.80    Years: 20.00    Total pack years: 16.00  Types: Cigarettes    Quit date: 07/29/2013    Years since quitting: 8.2   Smokeless tobacco: Never  Substance Use Topics   Alcohol use: No   Drug use: No     Allergies   Patient has no known allergies.   Review of Systems Review of Systems Per HPI  Physical Exam Triage Vital Signs ED Triage Vitals  Enc Vitals Group     BP 10/31/21 1019 130/84     Pulse Rate 10/31/21 1019 92     Resp 10/31/21 1019 20     Temp 10/31/21 1019 98.9 F (37.2 C)     Temp Source 10/31/21  1019 Oral     SpO2 10/31/21 1019 98 %     Weight --      Height --      Head Circumference --      Peak Flow --      Pain Score 10/31/21 1016 5     Pain Loc --      Pain Edu? --      Excl. in [place]? --    No data found.  Updated Vital Signs BP 130/84 (BP Location: Right Arm)   Pulse 92   Temp 98.9 F (37.2 C) (Oral)   Resp 20   SpO2 98%   Visual Acuity Right Eye Distance:   Left Eye Distance:   Bilateral Distance:    Right Eye Near:   Left Eye Near:    Bilateral Near:     Physical Exam Vitals and nursing note reviewed.  Constitutional:      General: She is not in acute distress.    Appearance: Normal appearance. She is not ill-appearing or toxic-appearing.  HENT:     Head: Normocephalic and atraumatic.     Right Ear: Tympanic membrane, ear canal and external ear normal.     Left Ear: Tympanic membrane, ear canal and external ear normal.     Nose: No congestion or rhinorrhea.     Mouth/Throat:     Mouth: Mucous membranes are moist.     Pharynx: Oropharynx is clear. Posterior oropharyngeal erythema present. No oropharyngeal exudate.     Comments: Postnasal drainage Eyes:     General: No scleral icterus.    Extraocular Movements: Extraocular movements intact.  Cardiovascular:     Rate and Rhythm: Normal rate and regular rhythm.  Pulmonary:     Effort: Pulmonary effort is normal. No respiratory distress.     Breath sounds: Normal breath sounds. No wheezing, rhonchi or rales.     Comments: Frequent coughing with expiration, no increased work of breathing, respiratory distress, or tachypnea.  Patient talking in complete sentences without distress. Abdominal:     General: Abdomen is flat. Bowel sounds are normal. There is no distension.     Palpations: Abdomen is soft.  Musculoskeletal:     Cervical back: Normal range of motion and neck supple.  Lymphadenopathy:     Cervical: No cervical adenopathy.  Skin:    General: Skin is warm and dry.     Coloration: Skin is  not jaundiced or pale.     Findings: No erythema or rash.  Neurological:     Mental Status: She is alert and oriented to person, place, and time.     Motor: No weakness.  Psychiatric:        Behavior: Behavior is cooperative.      UC Treatments / Results  Labs (all labs ordered are listed, but only abnormal results  are displayed) Labs Reviewed - No data to display  EKG   Radiology No results found.  Procedures Procedures (including critical care time)  Medications Ordered in UC Medications  albuterol (PROVENTIL) (2.5 MG/3ML) 0.083% nebulizer solution 2.5 mg (2.5 mg Nebulization Given 10/31/21 1033)    Initial Impression / Assessment and Plan / UC Course  I have reviewed the triage vital signs and the nursing notes.  Pertinent labs & imaging results that were available during my care of the patient were reviewed by me and considered in my medical decision making (see chart for details).   Patient is well-appearing, normotensive, afebrile, not tachycardic, not tachypneic, oxygenating well on room air.    Viral URI with cough Wheezing Suspect viral upper respiratory infection Breathing treatment given in urgent care which fully resolved expiratory wheezing, however patient reports feeling about the same after the treatment COVID-19, influenza testing deferred at this time-patient had COVID-19 3 months ago and does not have influenza symptoms Supportive care discussed Start cough suppressants ER and return precautions discussed Note given for work  The patient was given the opportunity to ask questions.  All questions answered to their satisfaction.  The patient is in agreement to this plan.    Final Clinical Impressions(s) / UC Diagnoses   Final diagnoses:  Viral URI with cough  Wheezing     Discharge Instructions      You have a viral upper respiratory infection.  This should improve over the next 7 to 10 days.  If your symptoms worsen, go to the emergency  room.  We have given you an albuterol breathing treatment today which helped open up your lungs a little bit.  Please continue the albuterol inhaler at home every 4-6 hours as needed for wheezing or shortness of breath.  Some things that can make you feel better are: - Increased rest - Increasing fluid with water/sugar free electrolytes - Acetaminophen and ibuprofen as needed for fever/pain - Salt water gargling, chloraseptic spray and throat lozenges - OTC guaifenesin (Mucinex) 600 mg twice daily - Saline sinus flushes or a neti pot - Humidifying the air -Tessalon Perles as needed during the day for dry cough and cough syrup at nighttime as needed for dry cough     ED Prescriptions     Medication Sig Dispense Auth. Provider   promethazine-dextromethorphan (PROMETHAZINE-DM) 6.25-15 MG/5ML syrup Take 5 mLs by mouth at bedtime as needed. 118 mL Cathlean Marseilles A, NP   benzonatate (TESSALON) 100 MG capsule Take 1 capsule (100 mg total) by mouth 3 (three) times daily as needed for cough. Do not take with alcohol or while driving or operating heavy machinery.  May cause drowsiness. 21 capsule Valentino Nose, NP      PDMP not reviewed this encounter.   Valentino Nose, NP 10/31/21 [phone]

## 2021-10-31 NOTE — ED Triage Notes (Signed)
Pt states that cough started yesterday, chest tightness and SOB started today. She said when she takes a deep breath she gets a stabbing pain in her back. She did start with a fever last night 101.3 and took some tylenol last night and today. She does have a albuterol MDI but has not been using it.

## 2021-10-31 NOTE — Discharge Instructions (Signed)
You have a viral upper respiratory infection.  This should improve over the next 7 to 10 days.  If your symptoms worsen, go to the emergency room.  We have given you an albuterol breathing treatment today which helped open up your lungs a little bit.  Please continue the albuterol inhaler at home every 4-6 hours as needed for wheezing or shortness of breath.  Some things that can make you feel better are: - Increased rest - Increasing fluid with water/sugar free electrolytes - Acetaminophen and ibuprofen as needed for fever/pain - Salt water gargling, chloraseptic spray and throat lozenges - OTC guaifenesin (Mucinex) 600 mg twice daily - Saline sinus flushes or a neti pot - Humidifying the air -Tessalon Perles as needed during the day for dry cough and cough syrup at nighttime as needed for dry cough

## 2022-03-06 ENCOUNTER — Encounter: Payer: Self-pay | Admitting: Radiology

## 2022-06-02 ENCOUNTER — Other Ambulatory Visit: Payer: Self-pay

## 2022-06-02 ENCOUNTER — Ambulatory Visit
Admission: RE | Admit: 2022-06-02 | Discharge: 2022-06-02 | Disposition: A | Discharge status: Home / Self Care | Payer: Self-pay | Source: Ambulatory Visit | Attending: Nurse Practitioner | Admitting: Nurse Practitioner

## 2022-06-02 VITALS — BP 143/92 | HR 91 | Temp 98.1°F | Resp 20

## 2022-06-02 DIAGNOSIS — R21 Rash and other nonspecific skin eruption: Secondary | ICD-10-CM

## 2022-06-02 MED ORDER — PREDNISONE 10 MG (21) PO TBPK: ORAL_TABLET | ORAL | 0 refills | Status: AC

## 2022-06-02 MED ORDER — DEXAMETHASONE SODIUM PHOSPHATE 10 MG/ML IJ SOLN
10.0000 mg | Freq: Once | INTRAMUSCULAR | Status: AC
  Administered 2022-06-02: 10 mg via INTRAMUSCULAR

## 2022-06-02 NOTE — ED Triage Notes (Signed)
Pt reports painful rash to neck that extends to lower back since tuesday. Pt reports pain radiates to bilateral lower extremities. Pt denies any new self care products or medications. Was seen for similar on Thursday by [facility] and reports was started on steroid cream and reports "pain has gotten worse."

## 2022-06-02 NOTE — ED Provider Notes (Signed)
[facility]    CSN: [id number] Arrival date & time: 06/02/22  1146      History   Chief Complaint Chief Complaint  Patient presents with   Rash    Painful rash on neck and back - Entered by patient    HPI Valerie Mitchell is a 47 y.o. female.   Patient presents today with painful rash from her neck to her back has been ongoing for the past 6 days.  Reports the rash is "burning".  Reports the burning radiates to bilateral lower extremities.  No recent change in detergents, soaps, personal care products.  She is using triamcinolone cream as prescribed by [facility] which made the pain/burning worse.  No shortness of breath, throat or tongue swelling.  No new muscle pain/joint aches.  Has been taking Claritin and Benadryl for symptoms without improvement.    Past Medical History:  Diagnosis Date   Pancreatitis 11/2008   likely r/t ETOH   Pyelonephritis 2010   Renal insufficiency     Patient Active Problem List   Diagnosis Date Noted   Pelvic abscess in female 08/04/2016   Severe sepsis (HCC) 08/04/2016   Encephalopathy 08/03/2016   Change in mental status    Acute appendicitis 07/30/2016   Appendicitis 07/29/2016   Gastroparesis 03/27/2010   Chronic epigastric pain 03/27/2010   IBS (irritable bowel syndrome) 03/27/2010   Chronic diarrhea 03/27/2010   Normocytic anemia 03/27/2010   Transaminitis 03/27/2010   Elevated serum creatinine 03/27/2010   HEMATOCHEZIA 02/12/2010   Abdominal pain 02/12/2010    Past Surgical History:  Procedure Laterality Date   APPENDECTOMY     EGD, Dr. Ronni Rumble [facility], bile-stained gastric mucosa  03/08/10   EUS, Dr. Sonda Primes chronic pancreatitis, duodenal erosion, gb adenomyotosis  01/10/09   LAPAROSCOPIC APPENDECTOMY N/A 07/30/2016   Procedure: APPENDECTOMY LAPAROSCOPIC;  Surgeon: Abigail Miyamoto, MD;  Location: [facility];  Service: General;  Laterality: N/A;   LAPAROSCOPIC CHOLECYSTECTOMY  11/11   TCS, Dr.  Elmer Ramp colon and TI  02/14/10    OB History   No obstetric history on file.      Home Medications    Prior to Admission medications   Medication Sig Start Date End Date Taking? Authorizing Provider  predniSONE (STERAPRED UNI-PAK 21 TAB) 10 MG (21) TBPK tablet Take per package instructions 06/02/22  Yes Cathlean Marseilles A, NP  albuterol (VENTOLIN HFA) 108 (90 Base) MCG/ACT inhaler Inhale 1-2 puffs into the lungs every 6 (six) hours as needed for wheezing or shortness of breath. 10/26/20   Particia Nearing, PA-C  albuterol (VENTOLIN HFA) 108 (90 Base) MCG/ACT inhaler Inhale 1-2 puffs into the lungs every 6 (six) hours as needed for wheezing or shortness of breath. Patient not taking: Reported on 06/02/2022 07/29/21   Particia Nearing, PA-C  Multiple Vitamin (MULTIVITAMIN) tablet Take 1 tablet by mouth daily.    [provider]    Family History Family History  Problem Relation Age of Onset   Colon cancer Maternal Grandfather    Stroke Mother     Social History Social History   Tobacco Use   Smoking status: Former    Packs/day: 0.80    Years: 20.00    Additional pack years: 0.00    Total pack years: 16.00    Types: Cigarettes    Quit date: 07/29/2013    Years since quitting: 8.8   Smokeless tobacco: Never  Substance Use Topics   Alcohol use: No   Drug use: No  Allergies   Patient has no known allergies.   Review of Systems Review of Systems Per HPI  Physical Exam Triage Vital Signs ED Triage Vitals  Enc Vitals Group     BP 06/02/22 1154 (!) 143/92     Pulse Rate 06/02/22 1154 91     Resp 06/02/22 1154 20     Temp 06/02/22 1154 98.1 F (36.7 C)     Temp Source 06/02/22 1154 Oral     SpO2 06/02/22 1154 98 %     Weight --      Height --      Head Circumference --      Peak Flow --      Pain Score 06/02/22 1153 7     Pain Loc --      Pain Edu? --      Excl. in GC? --    No data found.  Updated Vital Signs BP (!) 143/92  (BP Location: Right Arm)   Pulse 91   Temp 98.1 F (36.7 C) (Oral)   Resp 20   LMP 05/25/2022 (Approximate)   SpO2 98%   Visual Acuity Right Eye Distance:   Left Eye Distance:   Bilateral Distance:    Right Eye Near:   Left Eye Near:    Bilateral Near:     Physical Exam Vitals and nursing note reviewed.  Constitutional:      General: She is not in acute distress.    Appearance: Normal appearance. She is not toxic-appearing.  HENT:     Head: Normocephalic and atraumatic.     Mouth/Throat:     Mouth: Mucous membranes are moist.     Pharynx: Oropharynx is clear.  Pulmonary:     Effort: Pulmonary effort is normal. No respiratory distress.  Skin:    General: Skin is warm and dry.     Capillary Refill: Capillary refill takes less than 2 seconds.     Findings: Erythema and rash present.     Comments: Erythematous, widespread, papular rash to posterior neck and back down to sacrum.  No active drainage or oozing.  No warmth or fluctuance.  Neurological:     Mental Status: She is alert and oriented to person, place, and time.  Psychiatric:        Behavior: Behavior is cooperative.      UC Treatments / Results  Labs (all labs ordered are listed, but only abnormal results are displayed) Labs Reviewed - No data to display  EKG   Radiology No results found.  Procedures Procedures (including critical care time)  Medications Ordered in UC Medications  dexamethasone (DECADRON) injection 10 mg (10 mg Intramuscular Given 06/02/22 1210)    Initial Impression / Assessment and Plan / UC Course  I have reviewed the triage vital signs and the nursing notes.  Pertinent labs & imaging results that were available during my care of the patient were reviewed by me and considered in my medical decision making (see chart for details).   Patient is well-appearing, normotensive, afebrile, not tachycardic, not tachypneic, oxygenating well on room air.    1. Rash and nonspecific skin  eruption Suspect contact dermatitis, unknown irritant Treat with dexamethasone 10 mg IM today in urgent care Start oral prednisone tomorrow if not significantly improved ER and return precautions discussed with patient  The patient was given the opportunity to ask questions.  All questions answered to their satisfaction.  The patient is in agreement to this plan.    Final Clinical  Impressions(s) / UC Diagnoses   Final diagnoses:  Rash and nonspecific skin eruption     Discharge Instructions      I suspect the rash is coming from something that has come into contact with the skin in your back and neck.  We have given you a steroid shot today to help with the inflammatory process.  Start the oral prednisone taper tomorrow to help with inflammation.  Continue oral Benadryl and Claritin.  Seek care for persistent or worsening symptoms despite treatment.    ED Prescriptions     Medication Sig Dispense Auth. Provider   predniSONE (STERAPRED UNI-PAK 21 TAB) 10 MG (21) TBPK tablet Take per package instructions 21 each Valentino Nose, NP      PDMP not reviewed this encounter.   Valentino Nose, NP 06/02/22 [phone]

## 2022-06-02 NOTE — Discharge Instructions (Signed)
I suspect the rash is coming from something that has come into contact with the skin in your back and neck.  We have given you a steroid shot today to help with the inflammatory process.  Start the oral prednisone taper tomorrow to help with inflammation.  Continue oral Benadryl and Claritin.  Seek care for persistent or worsening symptoms despite treatment.

## 2022-12-07 ENCOUNTER — Ambulatory Visit
Admission: EM | Admit: 2022-12-07 | Discharge: 2022-12-07 | Disposition: A | Discharge status: Home / Self Care | Payer: Self-pay

## 2022-12-07 DIAGNOSIS — J22 Unspecified acute lower respiratory infection: Secondary | ICD-10-CM

## 2022-12-07 DIAGNOSIS — R062 Wheezing: Secondary | ICD-10-CM

## 2022-12-07 DIAGNOSIS — H6991 Unspecified Eustachian tube disorder, right ear: Secondary | ICD-10-CM

## 2022-12-07 MED ORDER — BENZONATATE 100 MG PO CAPS: 100.0000 mg | ORAL_CAPSULE | Freq: Three times a day (TID) | ORAL | 0 refills | Status: AC

## 2022-12-07 MED ORDER — DEXAMETHASONE SODIUM PHOSPHATE 10 MG/ML IJ SOLN
10.0000 mg | Freq: Once | INTRAMUSCULAR | Status: AC
  Administered 2022-12-07: 10 mg via INTRAMUSCULAR

## 2022-12-07 MED ORDER — AZITHROMYCIN 250 MG PO TABS: ORAL_TABLET | ORAL | 0 refills | Status: AC

## 2022-12-07 MED ORDER — PREDNISONE 20 MG PO TABS: 40.0000 mg | ORAL_TABLET | Freq: Every day | ORAL | 0 refills | Status: AC

## 2022-12-07 NOTE — ED Triage Notes (Signed)
Pt reports she has a cough and cannot hear out of right ear x 2.5 weeks.   Has tried multiple OTC pain meds   Is currently on amoxicillin prescribed by [facility]

## 2022-12-11 NOTE — ED Provider Notes (Signed)
[facility]    CSN: [id number] Arrival date & time: 12/07/22  1421      History   Chief Complaint Chief Complaint  Patient presents with   Cough    HPI Valerie Mitchell is a 47 y.o. female.   Presenting today with 2.5 weeks of productive cough, right ear pain and muffled hearing, congestion, fatigue. Currenly on amoxil, and trying OTC pain relievers with minimal relief. No known chronic pulmonary dz.     Past Medical History:  Diagnosis Date   Pancreatitis 11/2008   likely r/t ETOH   Pyelonephritis 2010   Renal insufficiency     Patient Active Problem List   Diagnosis Date Noted   Pelvic abscess in female 08/04/2016   Severe sepsis (HCC) 08/04/2016   Encephalopathy 08/03/2016   Change in mental status    Acute appendicitis 07/30/2016   Appendicitis 07/29/2016   Gastroparesis 03/27/2010   Chronic epigastric pain 03/27/2010   IBS (irritable bowel syndrome) 03/27/2010   Chronic diarrhea 03/27/2010   Normocytic anemia 03/27/2010   Transaminitis 03/27/2010   Elevated serum creatinine 03/27/2010   HEMATOCHEZIA 02/12/2010   Abdominal pain 02/12/2010    Past Surgical History:  Procedure Laterality Date   APPENDECTOMY     EGD, Dr. Ronni Rumble [facility], bile-stained gastric mucosa  03/08/10   EUS, Dr. Sonda Primes chronic pancreatitis, duodenal erosion, gb adenomyotosis  01/10/09   LAPAROSCOPIC APPENDECTOMY N/A 07/30/2016   Procedure: APPENDECTOMY LAPAROSCOPIC;  Surgeon: Abigail Miyamoto, MD;  Location: [facility];  Service: General;  Laterality: N/A;   LAPAROSCOPIC CHOLECYSTECTOMY  11/11   [facility], Dr. Elmer Ramp colon and TI  02/14/10    OB History   No obstetric history on file.      Home Medications    Prior to Admission medications   Medication Sig Start Date End Date Taking? Authorizing Provider  amoxicillin-clavulanate (AUGMENTIN) 875-125 MG tablet SMARTSIG:1 Tablet(s) By Mouth Every 12 Hours 12/01/22  Yes [provider]   azithromycin (ZITHROMAX) 250 MG tablet Take first 2 tablets together, then 1 every day until finished. 12/07/22  Yes Particia Nearing, PA-C  benzonatate (TESSALON) 100 MG capsule Take 1 capsule (100 mg total) by mouth every 8 (eight) hours. 12/07/22  Yes Particia Nearing, PA-C  predniSONE (DELTASONE) 20 MG tablet Take 2 tablets (40 mg total) by mouth daily with breakfast. 12/07/22  Yes Particia Nearing, PA-C  albuterol (VENTOLIN HFA) 108 (90 Base) MCG/ACT inhaler Inhale 1-2 puffs into the lungs every 6 (six) hours as needed for wheezing or shortness of breath. 10/26/20   Particia Nearing, PA-C  albuterol (VENTOLIN HFA) 108 (90 Base) MCG/ACT inhaler Inhale 1-2 puffs into the lungs every 6 (six) hours as needed for wheezing or shortness of breath. Patient not taking: Reported on 06/02/2022 07/29/21   Particia Nearing, PA-C  Multiple Vitamin (MULTIVITAMIN) tablet Take 1 tablet by mouth daily.    [provider]  predniSONE (STERAPRED UNI-PAK 21 TAB) 10 MG (21) TBPK tablet Take per package instructions 06/02/22   Valentino Nose, NP    Family History Family History  Problem Relation Age of Onset   Colon cancer Maternal Grandfather    Stroke Mother     Social History Social History   Tobacco Use   Smoking status: Former    Current packs/day: 0.00    Average packs/day: 0.8 packs/day for 20.0 years (16.0 ttl pk-yrs)    Types: Cigarettes    Start date: 07/29/1993    Quit date:  07/29/2013    Years since quitting: 9.3   Smokeless tobacco: Never  Substance Use Topics   Alcohol use: No   Drug use: No     Allergies   Patient has no known allergies.   Review of Systems Review of Systems PER HPI  Physical Exam Triage Vital Signs ED Triage Vitals  Encounter Vitals Group     BP 12/07/22 1506 (!) 133/95     Systolic BP Percentile --      Diastolic BP Percentile --      Pulse Rate 12/07/22 1506 93     Resp 12/07/22 1506 16     Temp 12/07/22 1506  98.9 F (37.2 C)     Temp Source 12/07/22 1506 Oral     SpO2 12/07/22 1506 97 %     Weight --      Height --      Head Circumference --      Peak Flow --      Pain Score 12/07/22 1508 5     Pain Loc --      Pain Education --      Exclude from Growth Chart --    No data found.  Updated Vital Signs BP (!) 133/95 (BP Location: Right Arm)   Pulse 93   Temp 98.9 F (37.2 C) (Oral)   Resp 16   LMP 11/07/2022 (Approximate)   SpO2 97%   Visual Acuity Right Eye Distance:   Left Eye Distance:   Bilateral Distance:    Right Eye Near:   Left Eye Near:    Bilateral Near:     Physical Exam Vitals and nursing note reviewed.  Constitutional:      Appearance: Normal appearance.  HENT:     Head: Atraumatic.     Right Ear: External ear normal.     Left Ear: Tympanic membrane and external ear normal.     Ears:     Comments: Right middle ear effusion    Nose: Congestion present.     Mouth/Throat:     Mouth: Mucous membranes are moist.     Pharynx: Posterior oropharyngeal erythema present.  Eyes:     Extraocular Movements: Extraocular movements intact.     Conjunctiva/sclera: Conjunctivae normal.  Cardiovascular:     Rate and Rhythm: Normal rate and regular rhythm.     Heart sounds: Normal heart sounds.  Pulmonary:     Effort: Pulmonary effort is normal.     Breath sounds: Wheezing present.  Musculoskeletal:        General: Normal range of motion.     Cervical back: Normal range of motion and neck supple.  Skin:    General: Skin is warm and dry.  Neurological:     Mental Status: She is alert and oriented to person, place, and time.  Psychiatric:        Mood and Affect: Mood normal.        Thought Content: Thought content normal.      UC Treatments / Results  Labs (all labs ordered are listed, but only abnormal results are displayed) Labs Reviewed - No data to display  EKG   Radiology No results found.  Procedures Procedures (including critical care  time)  Medications Ordered in UC Medications  dexamethasone (DECADRON) injection 10 mg (10 mg Intramuscular Given 12/07/22 1553)    Initial Impression / Assessment and Plan / UC Course  I have reviewed the triage vital signs and the nursing notes.  Pertinent labs &  imaging results that were available during my care of the patient were reviewed by me and considered in my medical decision making (see chart for details).     Given duration and worsening course, treat with IM decadron, zithromax, prednisone, tessalon and supportive OTC medications and home care. Return for worsening sxs.  Final Clinical Impressions(s) / UC Diagnoses   Final diagnoses:  Lower respiratory infection  Wheezing  Dysfunction of right eustachian tube   Discharge Instructions   None    ED Prescriptions     Medication Sig Dispense Auth. Provider   predniSONE (DELTASONE) 20 MG tablet Take 2 tablets (40 mg total) by mouth daily with breakfast. 10 tablet Particia Nearing, PA-C   benzonatate (TESSALON) 100 MG capsule Take 1 capsule (100 mg total) by mouth every 8 (eight) hours. 21 capsule Particia Nearing, [place]   azithromycin (ZITHROMAX) 250 MG tablet Take first 2 tablets together, then 1 every day until finished. 6 tablet Particia Nearing, [place]      PDMP not reviewed this encounter.   Particia Nearing, [place] 12/11/22 1927

## 2023-10-26 ENCOUNTER — Encounter (HOSPITAL_COMMUNITY): Payer: Self-pay

## 2023-11-18 ENCOUNTER — Encounter (HOSPITAL_COMMUNITY): Payer: Self-pay
